# Patient Record
Sex: Female | Born: 1990
Health system: Southern US, Community
[De-identification: ages and names within clinical notes are randomized; demographics above are authoritative.]

## PROBLEM LIST (undated history)

## (undated) DIAGNOSIS — Z9109 Other allergy status, other than to drugs and biological substances: Secondary | ICD-10-CM

## (undated) DIAGNOSIS — K589 Irritable bowel syndrome without diarrhea: Secondary | ICD-10-CM

## (undated) DIAGNOSIS — K200 Eosinophilic esophagitis: Secondary | ICD-10-CM

## (undated) HISTORY — PX: UPPER GI ENDOSCOPY: SHX6162

## (undated) HISTORY — DX: Other allergy status, other than to drugs and biological substances: Z91.09

## (undated) HISTORY — PX: WISDOM TOOTH EXTRACTION: SHX21

## (undated) HISTORY — PX: COLONOSCOPY: SHX174

## (undated) HISTORY — DX: Eosinophilic esophagitis: K20.0

---
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
## 2010-01-04 ENCOUNTER — Emergency Department (HOSPITAL_COMMUNITY): Admission: EM | Admit: 2010-01-04 | Discharge: 2010-01-04 | Payer: Self-pay | Admitting: Emergency Medicine

## 2012-11-30 HISTORY — PX: BUNIONECTOMY: SHX129

## 2016-04-07 DIAGNOSIS — K200 Eosinophilic esophagitis: Secondary | ICD-10-CM | POA: Insufficient documentation

## 2016-04-07 DIAGNOSIS — J309 Allergic rhinitis, unspecified: Secondary | ICD-10-CM | POA: Insufficient documentation

## 2016-04-07 DIAGNOSIS — K219 Gastro-esophageal reflux disease without esophagitis: Secondary | ICD-10-CM | POA: Insufficient documentation

## 2016-04-07 HISTORY — DX: Eosinophilic esophagitis: K20.0

## 2016-05-22 ENCOUNTER — Encounter (HOSPITAL_COMMUNITY): Payer: Self-pay | Admitting: Emergency Medicine

## 2016-05-22 ENCOUNTER — Emergency Department (HOSPITAL_COMMUNITY)
Admission: EM | Admit: 2016-05-22 | Discharge: 2016-05-22 | Disposition: A | Discharge status: Home / Self Care | Payer: 59 | Attending: Emergency Medicine | Admitting: Emergency Medicine

## 2016-05-22 ENCOUNTER — Emergency Department (HOSPITAL_COMMUNITY): Payer: 59

## 2016-05-22 DIAGNOSIS — N201 Calculus of ureter: Secondary | ICD-10-CM | POA: Diagnosis not present

## 2016-05-22 DIAGNOSIS — R109 Unspecified abdominal pain: Secondary | ICD-10-CM | POA: Diagnosis present

## 2016-05-22 HISTORY — DX: Irritable bowel syndrome, unspecified: K58.9

## 2016-05-22 LAB — COMPREHENSIVE METABOLIC PANEL
ALK PHOS: 47 U/L (ref 38–126)
ALT: 16 U/L (ref 14–54)
ANION GAP: 12 (ref 5–15)
AST: 25 U/L (ref 15–41)
Albumin: 4.2 g/dL (ref 3.5–5.0)
BILIRUBIN TOTAL: 0.7 mg/dL (ref 0.3–1.2)
BUN: 14 mg/dL (ref 6–20)
CALCIUM: 10.2 mg/dL (ref 8.9–10.3)
CO2: 21 mmol/L — ABNORMAL LOW (ref 22–32)
Chloride: 105 mmol/L (ref 101–111)
Creatinine, Ser: 0.99 mg/dL (ref 0.44–1.00)
Glucose, Bld: 126 mg/dL — ABNORMAL HIGH (ref 65–99)
POTASSIUM: 3.5 mmol/L (ref 3.5–5.1)
Sodium: 138 mmol/L (ref 135–145)
TOTAL PROTEIN: 7.6 g/dL (ref 6.5–8.1)

## 2016-05-22 LAB — CBC
HCT: 38.9 % (ref 36.0–46.0)
HEMOGLOBIN: 13 g/dL (ref 12.0–15.0)
MCH: 28 pg (ref 26.0–34.0)
MCHC: 33.4 g/dL (ref 30.0–36.0)
MCV: 83.8 fL (ref 78.0–100.0)
Platelets: 376 10*3/uL (ref 150–400)
RBC: 4.64 MIL/uL (ref 3.87–5.11)
RDW: 12.9 % (ref 11.5–15.5)
WBC: 8.5 10*3/uL (ref 4.0–10.5)

## 2016-05-22 LAB — URINALYSIS, ROUTINE W REFLEX MICROSCOPIC
BILIRUBIN URINE: NEGATIVE
Glucose, UA: NEGATIVE mg/dL
Ketones, ur: NEGATIVE mg/dL
NITRITE: NEGATIVE
Protein, ur: NEGATIVE mg/dL
SPECIFIC GRAVITY, URINE: 1.016 (ref 1.005–1.030)
pH: 6.5 (ref 5.0–8.0)

## 2016-05-22 LAB — LIPASE, BLOOD: Lipase: 35 U/L (ref 11–51)

## 2016-05-22 LAB — URINE MICROSCOPIC-ADD ON

## 2016-05-22 LAB — POC URINE PREG, ED: PREG TEST UR: NEGATIVE

## 2016-05-22 MED ORDER — ONDANSETRON HCL 4 MG/2ML IJ SOLN
4.0000 mg | Freq: Once | INTRAMUSCULAR | Status: AC
  Administered 2016-05-22: 4 mg via INTRAVENOUS
  Filled 2016-05-22: qty 2

## 2016-05-22 MED ORDER — OXYCODONE-ACETAMINOPHEN 5-325 MG PO TABS: 1.0000 | ORAL_TABLET | Freq: Four times a day (QID) | ORAL | Status: DC | PRN

## 2016-05-22 MED ORDER — ONDANSETRON HCL 4 MG PO TABS: 4.0000 mg | ORAL_TABLET | Freq: Four times a day (QID) | ORAL | Status: DC | PRN

## 2016-05-22 MED ORDER — HYDROMORPHONE HCL 1 MG/ML IJ SOLN
1.0000 mg | Freq: Once | INTRAMUSCULAR | Status: AC
  Administered 2016-05-22: 1 mg via INTRAVENOUS
  Filled 2016-05-22: qty 1

## 2016-05-22 MED ORDER — SODIUM CHLORIDE 0.9 % IV BOLUS (SEPSIS)
1000.0000 mL | Freq: Once | INTRAVENOUS | Status: AC
Start: 2016-05-22 — End: 2016-05-22
  Administered 2016-05-22: 1000 mL via INTRAVENOUS

## 2016-05-22 MED ORDER — TAMSULOSIN HCL 0.4 MG PO CAPS: 0.4000 mg | ORAL_CAPSULE | Freq: Every day | ORAL | Status: DC

## 2016-05-22 MED ORDER — IBUPROFEN 600 MG PO TABS: 600.0000 mg | ORAL_TABLET | Freq: Four times a day (QID) | ORAL | Status: DC | PRN

## 2016-05-22 MED ORDER — KETOROLAC TROMETHAMINE 30 MG/ML IJ SOLN
30.0000 mg | Freq: Once | INTRAMUSCULAR | Status: AC
  Administered 2016-05-22: 30 mg via INTRAVENOUS
  Filled 2016-05-22: qty 1

## 2016-05-22 NOTE — ED Notes (Signed)
Pt reporting pain, burning with urination.

## 2016-05-22 NOTE — ED Provider Notes (Signed)
CSN: [id number]     Arrival date & time 05/22/16  0018 History   First MD Initiated Contact with Patient 05/22/16 [phone]     Chief Complaint  Patient presents with  . Back Pain  . Abdominal Pain     (Consider location/radiation/quality/duration/timing/severity/associated sxs/prior Treatment) HPI Comments: 25 year old female with a history of irritable bowel syndrome presents to the emergency department for evaluation of left flank pain and abdominal pain. She states that symptoms began yesterday and pain has remained constant; it is waxing and waning in severity and significantly worsened this evening. She reports that pain radiates from her left flank around to her left lower abdomen. She describes the pain as burning in sensation. She feels as though this translates to burning dysuria as well. She took Tylenol for symptoms without significant relief. She also complained of nausea which temporarily improved with Zofran. Patient has no personal history of kidney stones. She has had no vomiting, diarrhea, fever, chills, hematuria, or vaginal complaints. No history of abdominal surgeries.  Patient is a 25 y.o. female presenting with back pain and abdominal pain. The history is provided by the patient. No language interpreter was used.  Back Pain Associated symptoms: abdominal pain and dysuria   Associated symptoms: no fever   Abdominal Pain Associated symptoms: dysuria and nausea   Associated symptoms: no fever and no vomiting     Past Medical History  Diagnosis Date  . IBS (irritable bowel syndrome)    Past Surgical History  Procedure Laterality Date  . Colonoscopy    . Wisdom tooth extraction     No family history on file. Social History  Substance Use Topics  . Smoking status: Never Smoker   . Smokeless tobacco: None  . Alcohol Use: No   OB History    No data available      Review of Systems  Constitutional: Negative for fever.  Gastrointestinal: Positive for nausea and  abdominal pain. Negative for vomiting.  Genitourinary: Positive for dysuria and flank pain.  Musculoskeletal: Positive for back pain.  All other systems reviewed and are negative.   Allergies  Sesame seed (diagnostic)  Home Medications   Prior to Admission medications   Medication Sig Start Date End Date Taking? Authorizing Provider  loratadine (CLARITIN) 10 MG tablet Take 10 mg by mouth daily.   Yes Historical Provider, MD  saccharomyces boulardii (FLORASTOR) 250 MG capsule Take 250 mg by mouth daily.   Yes Historical Provider, MD  TURMERIC PO Take 1 tablet by mouth daily.   Yes Historical Provider, MD   BP 120/83 mmHg  Pulse 87  Temp(Src) 97.5 F (36.4 C) (Oral)  Resp 30  Ht 5\' 3"  (1.6 m)  Wt 57.607 kg  BMI 22.50 kg/m2  SpO2 100%  LMP 05/18/2016   Physical Exam  Constitutional: She is oriented to person, place, and time. She appears well-developed and well-nourished. No distress.  Nontoxic appearing and in no distress.  HENT:  Head: Normocephalic and atraumatic.  Eyes: Conjunctivae and EOM are normal. No scleral icterus.  Neck: Normal range of motion.  Cardiovascular: Normal rate, regular rhythm and intact distal pulses.   Pulmonary/Chest: Effort normal and breath sounds normal. No respiratory distress. She has no wheezes. She has no rales.  Respirations even and unlabored  Abdominal: Soft. She exhibits no distension. There is tenderness. There is no rebound and no guarding.  Tenderness to palpation to the left midabdomen. Abdomen is soft. No masses. No peritoneal signs.  Musculoskeletal: Normal range of  motion.  Neurological: She is alert and oriented to person, place, and time. She exhibits normal muscle tone. Coordination normal.  Patient moving all extremities.  Skin: Skin is warm and dry. No rash noted. She is not diaphoretic. No erythema. No pallor.  Psychiatric: She has a normal mood and affect. Her behavior is normal.  Nursing note and vitals reviewed.   ED  Course  Procedures (including critical care time) Labs Review Labs Reviewed  COMPREHENSIVE METABOLIC PANEL - Abnormal; Notable for the following:    CO2 21 (*)    Glucose, Bld 126 (*)    All other components within normal limits  URINALYSIS, ROUTINE W REFLEX MICROSCOPIC (NOT AT [facility]) - Abnormal; Notable for the following:    Hgb urine dipstick MODERATE (*)    Leukocytes, UA MODERATE (*)    All other components within normal limits  URINE MICROSCOPIC-ADD ON - Abnormal; Notable for the following:    Squamous Epithelial / LPF 0-5 (*)    Bacteria, UA RARE (*)    All other components within normal limits  URINE CULTURE  LIPASE, BLOOD  CBC  POC URINE PREG, ED    Imaging Review Ct Renal Stone Study  05/22/2016  CLINICAL DATA:  Initial evaluation for acute left flank pain. EXAM: CT ABDOMEN AND PELVIS WITHOUT CONTRAST TECHNIQUE: Multidetector CT imaging of the abdomen and pelvis was performed following the standard protocol without IV contrast. COMPARISON:  None available. FINDINGS: Visualized lung bases are clear. Limited noncontrast evaluation of the liver is unremarkable. Gallbladder contracted without acute abnormality. No biliary dilatation. Spleen, adrenal glands, and pancreas demonstrate a normal unenhanced appearance. Scattered nonobstructive calculi measuring up to 3 mm present within the right kidney. No radiopaque calculi seen along the course of the right renal collecting system. There is neural right-sided hydronephrosis or hydroureter. On the left, there is an obstructive 4 mm stone positioned at the left UVJ with secondary mild left hydroureteronephrosis. No other radiopaque calculi seen along the course of the left ureter. Smudgy calcifications within the left kidney likely reflects tiny punctate nonobstructive calculi. Stomach within normal limits. No evidence for bowel obstruction. No abnormal wall thickening or inflammatory fat stranding seen about the bowels. Appendix within normal  limits. Bladder are unremarkable. Uterus and ovaries normal for patient age. No free air or fluid.  No adenopathy. No acute osseous abnormality. No worrisome lytic or blastic osseous lesions. Lucent lesion within the T11 vertebral body most likely reflects a benign hemangioma. IMPRESSION: 1. 4 mm obstructive left UVJ stone with secondary mild left hydroureteronephrosis. 2. Additional punctate nonobstructive nephrolithiasis as above. 3. No other acute intra-abdominal or pelvic process. Electronically Signed   By: Rise MuBenjamin  McClintock M.D.   On: 05/22/2016 05:42     I have personally reviewed and evaluated these images and lab results as part of my medical decision-making.   EKG Interpretation None      MDM   Final diagnoses:  Ureterolithiasis    Patient has been diagnosed with a left-sided kidney stone via CT. There is no evidence of significant hydronephrosis, serum creatine WNL, vitals sign stable and the pt does not have irratractable vomiting. Patient will be discharged home with pain medications and has been advised to follow up with urology. Return precautions discussed and provided. Patient discharged in satisfactory condition with no unaddressed concerns.     Antony MaduraKelly Jenisis Harmsen, PA-C 05/22/16 [id number]  Shon Batonourtney F Horton, MD 05/24/16 2018

## 2016-05-22 NOTE — Discharge Instructions (Signed)
Kidney Stones °Kidney stones (urolithiasis) are deposits that form inside your kidneys. The intense pain is caused by the stone moving through the urinary tract. When the stone moves, the ureter goes into spasm around the stone. The stone is usually passed in the urine.  °CAUSES  °· A disorder that makes certain neck glands produce too much parathyroid hormone (primary hyperparathyroidism). °· A buildup of uric acid crystals, similar to gout in your joints. °· Narrowing (stricture) of the ureter. °· A kidney obstruction present at birth (congenital obstruction). °· Previous surgery on the kidney or ureters. °· Numerous kidney infections. °SYMPTOMS  °· Feeling sick to your stomach (nauseous). °· Throwing up (vomiting). °· Blood in the urine (hematuria). °· Pain that usually spreads (radiates) to the groin. °· Frequency or urgency of urination. °DIAGNOSIS  °· Taking a history and physical exam. °· Blood or urine tests. °· CT scan. °· Occasionally, an examination of the inside of the urinary bladder (cystoscopy) is performed. °TREATMENT  °· Observation. °· Increasing your fluid intake. °· Extracorporeal shock wave lithotripsy--This is a noninvasive procedure that uses shock waves to break up kidney stones. °· Surgery may be needed if you have severe pain or persistent obstruction. There are various surgical procedures. Most of the procedures are performed with the use of small instruments. Only small incisions are needed to accommodate these instruments, so recovery time is minimized. °The size, location, and chemical composition are all important variables that will determine the proper choice of action for you. Talk to your health care provider to better understand your situation so that you will minimize the risk of injury to yourself and your kidney.  °HOME CARE INSTRUCTIONS  °· Drink enough water and fluids to keep your urine clear or pale yellow. This will help you to pass the stone or stone fragments. °· Strain  all urine through the provided strainer. Keep all particulate matter and stones for your health care provider to see. The stone causing the pain may be as small as a grain of salt. It is very important to use the strainer each and every time you pass your urine. The collection of your stone will allow your health care provider to analyze it and verify that a stone has actually passed. The stone analysis will often identify what you can do to reduce the incidence of recurrences. °· Only take over-the-counter or prescription medicines for pain, discomfort, or fever as directed by your health care provider. °· Keep all follow-up visits as told by your health care provider. This is important. °· Get follow-up X-rays if required. The absence of pain does not always mean that the stone has passed. It may have only stopped moving. If the urine remains completely obstructed, it can cause loss of kidney function or even complete destruction of the kidney. It is your responsibility to make sure X-rays and follow-ups are completed. Ultrasounds of the kidney can show blockages and the status of the kidney. Ultrasounds are not associated with any radiation and can be performed easily in a matter of minutes. °· Make changes to your daily diet as told by your health care provider. You may be told to: °¨ Limit the amount of salt that you eat. °¨ Eat 5 or more servings of fruits and vegetables each day. °¨ Limit the amount of meat, poultry, fish, and eggs that you eat. °· Collect a 24-hour urine sample as told by your health care provider. You may need to collect another urine sample every 6-12   months. °SEEK MEDICAL CARE IF: °· You experience pain that is progressive and unresponsive to any pain medicine you have been prescribed. °SEEK IMMEDIATE MEDICAL CARE IF:  °· Pain cannot be controlled with the prescribed medicine. °· You have a fever or shaking chills. °· The severity or intensity of pain increases over 18 hours and is not  relieved by pain medicine. °· You develop a new onset of abdominal pain. °· You feel faint or pass out. °· You are unable to urinate. °  °This information is not intended to replace advice given to you by your health care provider. Make sure you discuss any questions you have with your health care provider. °  °Document Released: 11/16/2005 Document Revised: 08/07/2015 Document Reviewed: 04/19/2013 °Elsevier Interactive Patient Education ©2016 [employer] ° °

## 2016-05-22 NOTE — ED Notes (Signed)
Pt. reports low back and low abdominal pain with nausea onset today , denies emesis or diarrhea , denies fever or chills.

## 2016-09-28 ENCOUNTER — Other Ambulatory Visit: Payer: Self-pay | Admitting: Family Medicine

## 2016-09-29 ENCOUNTER — Other Ambulatory Visit: Payer: Self-pay | Admitting: Family Medicine

## 2016-09-29 DIAGNOSIS — N63 Unspecified lump in unspecified breast: Secondary | ICD-10-CM

## 2016-10-20 ENCOUNTER — Other Ambulatory Visit: Payer: 59

## 2016-10-20 ENCOUNTER — Ambulatory Visit
Admission: RE | Admit: 2016-10-20 | Discharge: 2016-10-20 | Disposition: A | Discharge status: Home / Self Care | Payer: 59 | Source: Ambulatory Visit | Attending: Family Medicine | Admitting: Family Medicine

## 2016-10-20 ENCOUNTER — Other Ambulatory Visit: Payer: Self-pay | Admitting: Family Medicine

## 2016-10-20 DIAGNOSIS — N63 Unspecified lump in unspecified breast: Secondary | ICD-10-CM

## 2017-12-29 ENCOUNTER — Other Ambulatory Visit (HOSPITAL_BASED_OUTPATIENT_CLINIC_OR_DEPARTMENT_OTHER): Payer: Self-pay

## 2017-12-29 DIAGNOSIS — G4710 Hypersomnia, unspecified: Secondary | ICD-10-CM

## 2017-12-29 NOTE — Progress Notes (Signed)
Mu

## 2018-01-31 ENCOUNTER — Ambulatory Visit (HOSPITAL_BASED_OUTPATIENT_CLINIC_OR_DEPARTMENT_OTHER): Payer: 59 | Attending: Internal Medicine | Admitting: Internal Medicine

## 2018-01-31 VITALS — Ht 63.0 in | Wt 128.0 lb

## 2018-01-31 DIAGNOSIS — G4719 Other hypersomnia: Secondary | ICD-10-CM | POA: Insufficient documentation

## 2018-01-31 DIAGNOSIS — G4710 Hypersomnia, unspecified: Secondary | ICD-10-CM

## 2018-02-01 ENCOUNTER — Ambulatory Visit (HOSPITAL_BASED_OUTPATIENT_CLINIC_OR_DEPARTMENT_OTHER): Payer: 59 | Attending: Internal Medicine | Admitting: Internal Medicine

## 2018-02-01 DIAGNOSIS — G4710 Hypersomnia, unspecified: Secondary | ICD-10-CM | POA: Diagnosis not present

## 2018-02-09 NOTE — Procedures (Signed)
   NAME: Patricia ShutterKayla Antonson DATE OF BIRTH:  06/11/1991 MEDICAL RECORD NUMBER [id number]  LOCATION: [facility]  PHYSICIAN: Deretha EmoryJames C Ezinne Yogi  DATE OF STUDY: 01/31/2018  SLEEP STUDY TYPE: Nocturnal Polysomnogram               REFERRING PHYSICIAN: Deretha Emorysborne, Floris Neuhaus C, MD  INDICATION FOR STUDY: excessive daytime sleepiness  EPWORTH SLEEPINESS SCORE:  12 HEIGHT: 5\' 3"  (160 cm)  WEIGHT: 128 lb (58.1 kg)    Body mass index is 22.67 kg/m.  NECK SIZE: 12 in.  MEDICATIONS  Patient self administered medications include: N/A. Medications administered during study include No sleep medicine administered.The patients SSRI and stimulants were held for two weeks prior to this test  SLEEP STUDY TECHNIQUE  A multi-channel overnight Polysomnography study was performed. The channels recorded and monitored were central and occipital EEG, electrooculogram (EOG), submentalis EMG (chin), nasal and oral airflow, thoracic and abdominal wall motion, anterior tibialis EMG, snore microphone, electrocardiogram, and a pulse oximetry.   TECHNICAL COMMENTS  Comments added by Technician: PT HAD TWO RESTROOM VISED Comments added by Scorer: N/A   SLEEP ARCHITECTURE  The study was initiated at 10:41:43 PM and terminated at 6:02:43 AM. The total recorded time was 441.0 minutes. EEG confirmed total sleep time was 365.0 minutes yielding a sleep efficiency of 82.8%%. Sleep onset after lights out was 50.5 minutes with a REM latency of 85.0 minutes. The patient spent 1.8%% of the night in stage N1 sleep, 35.9%% in stage N2 sleep, 36.6%% in stage N3 and 25.75% in REM. Wake after sleep onset (WASO) was 25.5 minutes. The Arousal Index was 13.3/hour.   RESPIRATORY PARAMETERS  There were a total of 0 respiratory disturbances out of which 0 were apneas ( 0 obstructive, 0 mixed, 0 central) and 0 hypopneas. The apnea/hypopnea index (AHI) was 0.0 events/hour. The central sleep apnea index was 0.0 events/hour. The REM AHI was  0.0 events/hour and NREM AHI was 0.0 events/hour. The supine AHI was 0.0 events/hour and the non supine AHI was 0.00 supine during 0.16% of sleep. Respiratory disturbances were associated with oxygen desaturation down to a nadir of 93.0% during sleep. The mean oxygen saturation during the study was 95.9%. The cumulative time under 88% oxygen saturation was 5.5 minutes.   LEG MOVEMENT DATA  The total leg movements were 0 with a resulting leg movement index of 0.0/hr . Associated arousal with leg movement index was 0.0/hr.   CARDIAC DATA  The underlying cardiac rhythm was most consistent with sinus rhythm. Mean heart rate during sleep was 72.5 bpm. Additional rhythm abnormalities include None.   IMPRESSIONS  - No Significant Obstructive Sleep apnea(OSA)  - No Significant Central Sleep Apnea (CSA) - No significant periodic leg movements(PLMs) during sleep.  - May proceed with MSLT  DIAGNOSIS  - Excessive daytime sleepiness  RECOMMENDATIONS  - May proceed with MSLT  Deretha EmoryJames C Quandre Polinski Sleep specialist, American Board of Internal Medicine  ELECTRONICALLY SIGNED ON:  02/09/2018, 9:25 PM [facility] PH: [phone]   FX: [phone] ACCREDITED BY THE AMERICAN ACADEMY OF SLEEP MEDICINE

## 2018-02-09 NOTE — Procedures (Signed)
    NAME: Patricia Harvey DATE OF BIRTH:  12-Jul-1991 MEDICAL RECORD NUMBER [id number]  LOCATION: [facility]  PHYSICIAN: Deretha EmoryJames C Osborne  DATE OF STUDY: 02/01/2018  SLEEP STUDY TYPE: Multiple Sleep Latency Test               REFERRING PHYSICIAN: Deretha Emorysborne, James C, MD  INDICATION FOR STUDY: Excessive daytime sleepiness with normal PSG  EPWORTH SLEEPINESS SCORE:  12 HEIGHT: 5\' 3"  (160 cm)  WEIGHT: 128 lb (58.1 kg)    Body mass index is 22.67 kg/m.  NECK SIZE: 12 in.  MEDICATIONS  Patient self administered medications include: N/A. Medications administered during study include No sleep medicine administered.Marland Kitchen.   SLEEP STUDY TECHNIQUE  A multiple sleep latency test was performed. The channels recorded and monitored were central and occipital EEG, electrooculogram (EOG), submentalis EMG (chin), and electrocardiogram.  TECHNICAL COMMENTS  Comments added by Technician: EDS. FATIGUE Comments added by Scorer: N/A   IMPRESSIONS  Objective evidence of hypersomnia, sleep onset latency 1:43 minutes. No sleep onset REMs present. This study does not suggest narcolepsy.  DIAGNOSIS  Idiopathic hypersomnia (327.11 [G47.11 ICD-10])  RECOMMENDATIONS  Return for follow up and management of Idiopathic Hypersomnia.  Deretha EmoryJames C Osborne Sleep specialist, American Board of Internal Medicine  ELECTRONICALLY SIGNED ON:  02/09/2018, 9:33 PM [facility] PH: [phone]   FX: [phone] ACCREDITED BY THE AMERICAN ACADEMY OF SLEEP MEDICINE

## 2018-05-06 ENCOUNTER — Encounter: Payer: Self-pay | Admitting: Allergy

## 2018-05-06 ENCOUNTER — Ambulatory Visit (INDEPENDENT_AMBULATORY_CARE_PROVIDER_SITE_OTHER): Payer: 59 | Admitting: Allergy

## 2018-05-06 ENCOUNTER — Other Ambulatory Visit: Payer: Self-pay | Admitting: Allergy

## 2018-05-06 VITALS — BP 108/60 | HR 92 | Temp 98.8°F | Resp 18 | Ht 63.0 in | Wt 129.0 lb

## 2018-05-06 DIAGNOSIS — K200 Eosinophilic esophagitis: Secondary | ICD-10-CM

## 2018-05-06 DIAGNOSIS — J309 Allergic rhinitis, unspecified: Secondary | ICD-10-CM

## 2018-05-06 DIAGNOSIS — J383 Other diseases of vocal cords: Secondary | ICD-10-CM | POA: Diagnosis not present

## 2018-05-06 DIAGNOSIS — H1010 Acute atopic conjunctivitis, unspecified eye: Secondary | ICD-10-CM | POA: Diagnosis not present

## 2018-05-06 MED ORDER — OLOPATADINE HCL 0.2 % OP SOLN: 1.0000 [drp] | Freq: Every day | OPHTHALMIC | 5 refills | Status: DC

## 2018-05-06 MED ORDER — MONTELUKAST SODIUM 10 MG PO TABS: 10.0000 mg | ORAL_TABLET | Freq: Every day | ORAL | 5 refills | Status: DC

## 2018-05-06 MED ORDER — OLOPATADINE HCL 0.1 % OP SOLN: 1.0000 [drp] | Freq: Two times a day (BID) | OPHTHALMIC | 5 refills | Status: DC

## 2018-05-06 MED ORDER — EPINEPHRINE 0.3 MG/0.3ML IJ SOAJ: 0.3000 mg | Freq: Once | INTRAMUSCULAR | 2 refills | Status: AC

## 2018-05-06 NOTE — Patient Instructions (Addendum)
Allergic rhinoconjunctivitis   -  Continue avoidance measures for pollen, dust mites   - Continue singulair, allegra, claritin, zyrtec PO daily; continue Pataday eye drops daily   - Serum environmental panel obtained today to update allergens since allergen immunotherapy is being considered.   - allergen immunotherapy discussed today including protocol, benefits and risk.  Informational handout provided.  If interested in this therapuetic option you can check with your insurance carrier for coverage.  Let us know if you would like to proceed with this option.    EoE  - continue current dietary avoidance for management of GI symptoms  Shortness of breath with activity   - discussed possible laryngeal spasm (vocal cord dysfunction) vs bronchospasm and briefly discussed breathing technique with exercise that might improve symptoms if laryngospasm   - discussed possible referral with speech therapy in future   - symptoms do not cause to stop activity and reports improve with continue activity thus does not feel she warrants albuterol inhaler at this time  Follow-up 6 months for follow-up

## 2018-05-06 NOTE — Progress Notes (Signed)
New Patient Note  RE: Patricia Harvey MRN: [id number] DOB: [birth year] Date of Office Visit: 05/06/2018  Referring provider: Vista Mink, MD Primary care provider: Tally Joe, MD  Chief Complaint: Allergies and eosinophilic esophagitis  History of present illness: Patricia Harvey is a 27 y.o. female presenting today for consultation for environmental allergies and EOE.  Patient reports years long history of sinus congestion, rhinorrhea, sneezing, itchy/watery eyes, itchy ears, scratchy throat with exposure to dust, pollen, fragrances, vacuuming, cats and dogs. She was seen by an allergist at [facility] in May 2017 and received prick and intradermal testing which revealed allergies to grass mix, oak/maple trees, ragweed, dust mites. She is on a regimen of singulair daily, zyrtec daily, claritin daily, and allegra daily; she uses flonase nasal spray, azelastine nasal spray, saline nasal spray, and a homeopathic nasal spray containing xylitol; she uses a neti pot during symptom exacerbations; she uses Pataday as well as OTC eye drops for dry itchy eyes.   Patient also has a h/o eosinophilic esophagitis diagnosed by Dr. Loreta Ave on EGD with biopsy showing 60 eos/hpf and has a h/o GERD. She was never on ingested fluticasone due to symptoms improvement with dietary changes. She reports significant improvement in symptoms with reducing acid-reflux symptoms with diet changes, and reducing intake of dairy, wheat, corn has significantly helped. She still occasional has pill dysphagia; last episode 1 wk ago. She has had food allergy testing which was negative and per her report, SB biopsy was negative for celiac disease. She has also been diagnosed with IBS which has significantly improved with the above diet changes.  Patient also reports some shortness of breath and increased yawning with running; this usually will not limit her activity and usually improves with continued activity; she denies wheezing or h/o asthma as a  child.   Review of systems: Review of Systems  Constitutional: Negative for chills, fever and malaise/fatigue.  HENT: Negative for congestion, ear discharge, ear pain, nosebleeds, sinus pain and sore throat.   Eyes: Negative for pain, discharge and redness.  Respiratory: Negative for cough, shortness of breath and wheezing.   Cardiovascular: Negative for chest pain.  Gastrointestinal: Negative for abdominal pain, constipation, diarrhea, heartburn, nausea and vomiting.  Musculoskeletal: Negative for joint pain.  Skin: Negative for itching and rash.  Neurological: Negative for headaches.    All other systems negative unless noted above in HPI  Past medical history: Past Medical History:  Diagnosis Date  . Environmental allergies   . Eosinophilic esophagitis   . IBS (irritable bowel syndrome)     Past surgical history: Past Surgical History:  Procedure Laterality Date  . COLONOSCOPY    . TONSILLECTOMY  2004  . WISDOM TOOTH EXTRACTION      Family history:  Family History  Problem Relation Age of Onset  . Allergic rhinitis Mother   . Allergic rhinitis Paternal Grandmother   . Allergic rhinitis Paternal Grandfather   . Eczema Neg Hx   . Urticaria Neg Hx   . Asthma Neg Hx   . Angioedema Neg Hx     Social history: She lives in a home with carpeting with gas and electric heating and central and window cooling.  There is a dog in the home.  There was concern for water damage and mildew but this was fixed.  There are no concerns for roaches in the home.  She is a substance abuse counselor.  She denies a smoking history.   Medication List: Allergies as of 05/06/2018  Reactions   Milk-related Compounds Diarrhea   Cramping   Sesame Seed (diagnostic) Diarrhea, Nausea Only, Other (See Comments)   cramping   Wheat Bran Diarrhea   Cramping      Medication List        Accurate as of 05/06/18  3:49 PM. Always use your most recent med list.          azelastine 0.1 %  nasal spray Commonly known as:  ASTELIN Place into the nose.   Bromelains 500 MG Tabs Take by mouth.   EPINEPHrine 0.3 mg/0.3 mL Soaj injection Commonly known as:  AUVI-Q Inject 0.3 mLs (0.3 mg total) into the muscle once for 1 dose. As directed for life-threatening allergic reactions   fexofenadine 180 MG tablet Commonly known as:  ALLEGRA Take by mouth.   fluticasone 50 MCG/ACT nasal spray Commonly known as:  FLONASE 2 sprays by Each Nare route daily.   ibuprofen 600 MG tablet Commonly known as:  ADVIL,MOTRIN Take 1 tablet (600 mg total) by mouth every 6 (six) hours as needed.   loratadine 10 MG tablet Commonly known as:  CLARITIN Take 10 mg by mouth daily.   montelukast 10 MG tablet Commonly known as:  SINGULAIR Take 1 tablet (10 mg total) by mouth at bedtime.   NAPHCON-A 0.025-0.3 % ophthalmic solution Generic drug:  naphazoline-pheniramine Place 1 drop into both eyes 4 (four) times daily as needed for eye irritation.   olopatadine 0.1 % ophthalmic solution Commonly known as:  PATANOL Place 1 drop into both eyes 2 (two) times daily.   ondansetron 4 MG disintegrating tablet Commonly known as:  ZOFRAN-ODT   ondansetron 4 MG tablet Commonly known as:  ZOFRAN Take 1 tablet (4 mg total) by mouth every 6 (six) hours as needed for nausea or vomiting.   OVER THE COUNTER MEDICATION   oxyCODONE-acetaminophen 5-325 MG tablet Commonly known as:  PERCOCET/ROXICET Take 1-2 tablets by mouth every 6 (six) hours as needed for severe pain.   saccharomyces boulardii 250 MG capsule Commonly known as:  FLORASTOR Take 250 mg by mouth daily.   sertraline 50 MG tablet Commonly known as:  ZOLOFT   tamsulosin 0.4 MG Caps capsule Commonly known as:  FLOMAX Take 1 capsule (0.4 mg total) by mouth daily. To promote stone movement   TURMERIC PO Take 1 tablet by mouth daily.   Turmeric Powd Take by mouth.       Known medication allergies: Allergies  Allergen Reactions    . Milk-Related Compounds Diarrhea    Cramping  . Sesame Seed (Diagnostic) Diarrhea, Nausea Only and Other (See Comments)    cramping  . Wheat Bran Diarrhea    Cramping     Physical examination: Blood pressure 108/60, pulse 92, temperature 98.8 F (37.1 C), temperature source Oral, resp. rate 18, height 5\' 3"  (1.6 m), weight 129 lb (58.5 kg), SpO2 97 %.  General: Alert, interactive, in no acute distress. HEENT: PERRLA, TMs pearly gray, turbinates mildly edematous without discharge, post-pharynx non erythematous. Neck: Supple without lymphadenopathy. Lungs: Clear to auscultation without wheezing, rhonchi or rales. {no increased work of breathing. CV: Normal S1, S2 without murmurs. Abdomen: Nondistended, nontender. Skin: Warm and dry, without lesions or rashes. Extremities:  No clubbing, cyanosis or edema. Neuro:   Grossly intact.  Diagnositics/Labs: None today  Assessment and plan:   Allergic rhinoconjunctivitis   -  Continue avoidance measures for pollen, dust mites   - Continue singulair, allegra, claritin, zyrtec PO daily; continue Pataday eye drops  daily   - Serum environmental panel obtained today to update allergens since allergen immunotherapy is being considered.   - allergen immunotherapy discussed today including protocol, benefits and risk.  Informational handout provided.  If interested in this therapuetic option you can check with your insurance carrier for coverage.  Let us know if you would like to proceed with this option.    EoE  - continue current dietary avoidance for management of GI symptoms  Shortness of breath with activity   - discussed possible laryngeal spasm (vocal cord dysfunction) vs bronchospasm and briefly discussed breathing technique with exercise that might improve symptoms if laryngospasm   - discussed possible referral with speech therapy in future   - symptoms do not cause to stop activity and reports improve with continue activity thus  does not feel she warrants albuterol inhaler at this time  Follow-up 6 months for follow-up  I appreciate the opportunity to take part in AlmontKayla's care. Please do not hesitate to contact me with questions.  Sincerely,   Margo AyeShaylar Raymund Manrique, MD Allergy/Immunology [facility]

## 2018-05-12 LAB — ALLERGENS, ZONE 2
Amer Sycamore IgE Qn: 1 kU/L — AB
Bahia Grass IgE: 0.69 kU/L — AB
Cat Dander IgE: <0.1 kU/L
Cedar, Mountain IgE: 0.34 kU/L — AB
Cockroach, American IgE: <0.1 kU/L
Common Silver Birch IgE: 0.58 kU/L — AB
D Farinae IgE: <0.1 kU/L
D Pteronyssinus IgE: <0.1 kU/L
G002-IGE BERMUDA GRASS: 0.56 kU/L — AB
G010-IGE JOHNSON GRASS: 0.53 kU/L — AB
Maple/Box Elder IgE: 0.73 kU/L — AB
Mugwort IgE Qn: 0.44 kU/L — AB
Oak, White IgE: 0.64 kU/L — AB
Pigweed, Rough IgE: 1.06 kU/L — AB
Plantain, English IgE: 0.49 kU/L — AB
SWEET GUM IGE RAST QL: 0.64 kU/L — AB
Sheep Sorrel IgE Qn: 0.54 kU/L — AB
T008-IGE ELM, AMERICAN: 3.05 kU/L — AB
T041-IGE HICKORY, WHITE: 34.9 kU/L — AB
TIMOTHY IGE: 0.56 kU/L — AB
W001-IGE RAGWEED, SHORT: 0.73 kU/L — AB
W020-IGE NETTLE: 1.09 kU/L — AB
WHITE MULBERRY IGE: 0.21 kU/L — AB

## 2018-05-23 ENCOUNTER — Telehealth: Payer: Self-pay

## 2018-05-23 NOTE — Telephone Encounter (Signed)
Patient called due to getting letter in mail about lab results. Patient stated that she called her insurance and they told her that the codes we gave her for the allergy injections were not enough information for them to tell her how much she would need to pay. Patient stated she needs to know how much before she can move forward and stated that she is going to have insurance call us in regards to this. I informed her the codes we gave her will be the same codes we give them. Patient understood and would like to move forward with the injections but she would need to know how much out of pocket before she can start.

## 2018-05-25 NOTE — Telephone Encounter (Signed)
Patient called to schedule her new start allergy injections for 06/08/18.

## 2018-05-27 NOTE — Addendum Note (Signed)
Addended by: Lorrin MaisPADGETT, SHAYLAR P on: 05/27/2018 08:48 AM   Modules accepted: Orders

## 2018-05-27 NOTE — Telephone Encounter (Signed)
Order done

## 2018-05-30 NOTE — Progress Notes (Signed)
VIALS EXP 05-31-19 

## 2018-06-01 DIAGNOSIS — J301 Allergic rhinitis due to pollen: Secondary | ICD-10-CM | POA: Diagnosis not present

## 2018-06-08 ENCOUNTER — Ambulatory Visit (INDEPENDENT_AMBULATORY_CARE_PROVIDER_SITE_OTHER): Payer: 59 | Admitting: *Deleted

## 2018-06-08 ENCOUNTER — Telehealth: Payer: Self-pay | Admitting: Allergy

## 2018-06-08 DIAGNOSIS — J309 Allergic rhinitis, unspecified: Secondary | ICD-10-CM | POA: Diagnosis not present

## 2018-06-08 NOTE — Progress Notes (Signed)
Immunotherapy   Patient Details  Name: Patricia Harvey MRN: [id number] Date of Birth: 11/04/1991  06/08/2018  Patricia Harvey started injections for  pollen Following schedule: B  Frequency:1 time per week Epi-Pen:Epi-Pen Available  Consent signed and patient instructions given. Patient did not wait her 30 min and left without arm being checked. Bennye AlmMildred Alick Lecomte 06/08/2018, 4:28 PM

## 2018-06-08 NOTE — Telephone Encounter (Signed)
Spoke with patient and informed her that the only thing in her vial is Pollen. She stated that the testing that was done on her back about a year ago showed she was allergic to D-mite and she wanted to be sure that pollen was the only thing she needed and that something didn't get left out of her vial.

## 2018-06-08 NOTE — Telephone Encounter (Signed)
Pt called and wanted to talk with someone about what is the vials. [phone].

## 2018-06-13 ENCOUNTER — Ambulatory Visit (INDEPENDENT_AMBULATORY_CARE_PROVIDER_SITE_OTHER): Payer: 59 | Admitting: *Deleted

## 2018-06-13 DIAGNOSIS — J309 Allergic rhinitis, unspecified: Secondary | ICD-10-CM | POA: Diagnosis not present

## 2018-06-17 ENCOUNTER — Ambulatory Visit (INDEPENDENT_AMBULATORY_CARE_PROVIDER_SITE_OTHER): Payer: 59

## 2018-06-17 DIAGNOSIS — J309 Allergic rhinitis, unspecified: Secondary | ICD-10-CM | POA: Diagnosis not present

## 2018-06-20 ENCOUNTER — Ambulatory Visit (INDEPENDENT_AMBULATORY_CARE_PROVIDER_SITE_OTHER): Payer: 59 | Admitting: *Deleted

## 2018-06-20 DIAGNOSIS — J309 Allergic rhinitis, unspecified: Secondary | ICD-10-CM | POA: Diagnosis not present

## 2018-06-24 ENCOUNTER — Ambulatory Visit (INDEPENDENT_AMBULATORY_CARE_PROVIDER_SITE_OTHER): Payer: 59

## 2018-06-24 DIAGNOSIS — J309 Allergic rhinitis, unspecified: Secondary | ICD-10-CM | POA: Diagnosis not present

## 2018-06-28 ENCOUNTER — Ambulatory Visit (INDEPENDENT_AMBULATORY_CARE_PROVIDER_SITE_OTHER): Payer: 59 | Admitting: *Deleted

## 2018-06-28 DIAGNOSIS — J309 Allergic rhinitis, unspecified: Secondary | ICD-10-CM

## 2018-07-01 ENCOUNTER — Ambulatory Visit (INDEPENDENT_AMBULATORY_CARE_PROVIDER_SITE_OTHER): Payer: 59

## 2018-07-01 DIAGNOSIS — J309 Allergic rhinitis, unspecified: Secondary | ICD-10-CM

## 2018-07-04 ENCOUNTER — Ambulatory Visit (INDEPENDENT_AMBULATORY_CARE_PROVIDER_SITE_OTHER): Payer: 59 | Admitting: *Deleted

## 2018-07-04 DIAGNOSIS — J309 Allergic rhinitis, unspecified: Secondary | ICD-10-CM

## 2018-07-07 ENCOUNTER — Ambulatory Visit (INDEPENDENT_AMBULATORY_CARE_PROVIDER_SITE_OTHER): Payer: 59 | Admitting: *Deleted

## 2018-07-07 DIAGNOSIS — J309 Allergic rhinitis, unspecified: Secondary | ICD-10-CM | POA: Diagnosis not present

## 2018-07-15 ENCOUNTER — Ambulatory Visit (INDEPENDENT_AMBULATORY_CARE_PROVIDER_SITE_OTHER): Payer: 59

## 2018-07-15 DIAGNOSIS — J309 Allergic rhinitis, unspecified: Secondary | ICD-10-CM

## 2018-07-18 ENCOUNTER — Ambulatory Visit (INDEPENDENT_AMBULATORY_CARE_PROVIDER_SITE_OTHER): Payer: 59 | Admitting: *Deleted

## 2018-07-18 DIAGNOSIS — J309 Allergic rhinitis, unspecified: Secondary | ICD-10-CM | POA: Diagnosis not present

## 2018-07-20 ENCOUNTER — Ambulatory Visit (INDEPENDENT_AMBULATORY_CARE_PROVIDER_SITE_OTHER): Payer: 59 | Admitting: *Deleted

## 2018-07-20 DIAGNOSIS — J309 Allergic rhinitis, unspecified: Secondary | ICD-10-CM | POA: Diagnosis not present

## 2018-07-27 ENCOUNTER — Ambulatory Visit (INDEPENDENT_AMBULATORY_CARE_PROVIDER_SITE_OTHER): Payer: 59

## 2018-07-27 DIAGNOSIS — J309 Allergic rhinitis, unspecified: Secondary | ICD-10-CM | POA: Diagnosis not present

## 2018-07-29 ENCOUNTER — Ambulatory Visit (INDEPENDENT_AMBULATORY_CARE_PROVIDER_SITE_OTHER): Payer: 59

## 2018-07-29 DIAGNOSIS — J309 Allergic rhinitis, unspecified: Secondary | ICD-10-CM | POA: Diagnosis not present

## 2018-08-02 ENCOUNTER — Ambulatory Visit (INDEPENDENT_AMBULATORY_CARE_PROVIDER_SITE_OTHER): Payer: 59

## 2018-08-02 ENCOUNTER — Other Ambulatory Visit: Payer: Self-pay | Admitting: Obstetrics & Gynecology

## 2018-08-02 DIAGNOSIS — R9389 Abnormal findings on diagnostic imaging of other specified body structures: Secondary | ICD-10-CM

## 2018-08-02 DIAGNOSIS — J309 Allergic rhinitis, unspecified: Secondary | ICD-10-CM

## 2018-08-04 ENCOUNTER — Ambulatory Visit (INDEPENDENT_AMBULATORY_CARE_PROVIDER_SITE_OTHER): Payer: 59 | Admitting: *Deleted

## 2018-08-04 ENCOUNTER — Ambulatory Visit
Admission: RE | Admit: 2018-08-04 | Discharge: 2018-08-04 | Disposition: A | Discharge status: Home / Self Care | Payer: 59 | Source: Ambulatory Visit | Attending: Obstetrics & Gynecology | Admitting: Obstetrics & Gynecology

## 2018-08-04 DIAGNOSIS — J309 Allergic rhinitis, unspecified: Secondary | ICD-10-CM | POA: Diagnosis not present

## 2018-08-04 DIAGNOSIS — R9389 Abnormal findings on diagnostic imaging of other specified body structures: Secondary | ICD-10-CM

## 2018-08-04 DIAGNOSIS — M5136 Other intervertebral disc degeneration, lumbar region: Secondary | ICD-10-CM | POA: Insufficient documentation

## 2018-08-04 DIAGNOSIS — M419 Scoliosis, unspecified: Secondary | ICD-10-CM | POA: Insufficient documentation

## 2018-08-08 ENCOUNTER — Ambulatory Visit (INDEPENDENT_AMBULATORY_CARE_PROVIDER_SITE_OTHER): Payer: 59 | Admitting: *Deleted

## 2018-08-08 DIAGNOSIS — J309 Allergic rhinitis, unspecified: Secondary | ICD-10-CM | POA: Diagnosis not present

## 2018-08-10 ENCOUNTER — Ambulatory Visit (INDEPENDENT_AMBULATORY_CARE_PROVIDER_SITE_OTHER): Payer: 59 | Admitting: *Deleted

## 2018-08-10 DIAGNOSIS — J309 Allergic rhinitis, unspecified: Secondary | ICD-10-CM

## 2018-08-15 ENCOUNTER — Ambulatory Visit (INDEPENDENT_AMBULATORY_CARE_PROVIDER_SITE_OTHER): Payer: 59 | Admitting: *Deleted

## 2018-08-15 DIAGNOSIS — J309 Allergic rhinitis, unspecified: Secondary | ICD-10-CM

## 2018-08-16 ENCOUNTER — Other Ambulatory Visit: Payer: Self-pay | Admitting: Obstetrics & Gynecology

## 2018-08-16 ENCOUNTER — Telehealth: Payer: Self-pay | Admitting: *Deleted

## 2018-08-16 DIAGNOSIS — R928 Other abnormal and inconclusive findings on diagnostic imaging of breast: Secondary | ICD-10-CM

## 2018-08-16 NOTE — Telephone Encounter (Signed)
Spoke with patient to follow up from yesterday shot that was given in error. She states she did not have any problems after leaving the office.

## 2018-08-22 ENCOUNTER — Ambulatory Visit (INDEPENDENT_AMBULATORY_CARE_PROVIDER_SITE_OTHER): Payer: 59

## 2018-08-22 DIAGNOSIS — J309 Allergic rhinitis, unspecified: Secondary | ICD-10-CM

## 2018-08-22 NOTE — Progress Notes (Signed)
Patient stated that when her injection is giving in the back of the arm she ends up having a reaction (large hive) however if it is giving more in the middle of the arm patient stated she doesn't have reactions. Patient will inform nurse before giving injection.

## 2018-08-30 ENCOUNTER — Ambulatory Visit (INDEPENDENT_AMBULATORY_CARE_PROVIDER_SITE_OTHER): Payer: 59 | Admitting: *Deleted

## 2018-08-30 DIAGNOSIS — J309 Allergic rhinitis, unspecified: Secondary | ICD-10-CM

## 2018-09-05 ENCOUNTER — Telehealth: Payer: Self-pay

## 2018-09-05 NOTE — Telephone Encounter (Signed)
Patient called and wanted to know if she left her water bottle in shot room waiting area. I informed her that it was here. Patient then informed me that she has been having some allergy issues and was wanting to know if she should come in tomorrow to get her injection or wait. She has been having nasal drainage, cough and has went to doctor and got some nasal sprays and cough medication. I informed patient that she should hold on off on her allergy injections this week and if she is feeling better to come next week.

## 2018-09-13 ENCOUNTER — Ambulatory Visit (INDEPENDENT_AMBULATORY_CARE_PROVIDER_SITE_OTHER): Payer: 59 | Admitting: *Deleted

## 2018-09-13 DIAGNOSIS — J309 Allergic rhinitis, unspecified: Secondary | ICD-10-CM | POA: Diagnosis not present

## 2018-09-20 ENCOUNTER — Ambulatory Visit (INDEPENDENT_AMBULATORY_CARE_PROVIDER_SITE_OTHER): Payer: 59 | Admitting: *Deleted

## 2018-09-20 ENCOUNTER — Telehealth: Payer: Self-pay | Admitting: *Deleted

## 2018-09-20 DIAGNOSIS — J309 Allergic rhinitis, unspecified: Secondary | ICD-10-CM

## 2018-09-20 MED ORDER — IPRATROPIUM BROMIDE 0.06 % NA SOLN: 2.0000 | Freq: Three times a day (TID) | NASAL | 5 refills | Status: DC | PRN

## 2018-09-20 NOTE — Progress Notes (Signed)
Patient requested last office visit note advised could not give note without a medical release advised we could give AVS printout from that day. Patient verbalized understanding and wanted AVS printed out given copy to patient

## 2018-09-20 NOTE — Telephone Encounter (Signed)
Patient came in to get allergy shot today states she got a cold and went to see pcp was prescribed Ventolin HFA and Ipratropium nasal spray. Patient states that the nasal spray has helped tremendously with eye and nasal symptoms. Patient was wondering if we can refill nasal spray for her she is aware she is due for an appt Dec 2019 appt made. Dr Delorse Lek please advise.

## 2018-09-20 NOTE — Telephone Encounter (Signed)
Called and spoke with patient, informed her that medication would be sent in. Confirmed to send medicine in to [facility] [place]. Patient advised that her inhaler was too much for the name brand. Advised patient to speak with her pharmacy to determine which type of inhaler was cheaper and to call us when she needs a refill and inform us and we will be sure to fill the less expensive emergency inhaler.

## 2018-09-20 NOTE — Telephone Encounter (Signed)
Oh yes that is fine to refill her nasal ipratropium 2 sprays each nostril TID prn nasal drainage

## 2018-09-28 ENCOUNTER — Ambulatory Visit (INDEPENDENT_AMBULATORY_CARE_PROVIDER_SITE_OTHER): Payer: 59 | Admitting: *Deleted

## 2018-09-28 DIAGNOSIS — J309 Allergic rhinitis, unspecified: Secondary | ICD-10-CM

## 2018-10-04 ENCOUNTER — Ambulatory Visit (INDEPENDENT_AMBULATORY_CARE_PROVIDER_SITE_OTHER): Payer: 59 | Admitting: *Deleted

## 2018-10-04 DIAGNOSIS — J309 Allergic rhinitis, unspecified: Secondary | ICD-10-CM

## 2018-10-05 DIAGNOSIS — J301 Allergic rhinitis due to pollen: Secondary | ICD-10-CM

## 2018-10-05 NOTE — Progress Notes (Signed)
VIAL EXP 12-15-18 

## 2018-10-11 ENCOUNTER — Ambulatory Visit (INDEPENDENT_AMBULATORY_CARE_PROVIDER_SITE_OTHER): Payer: 59 | Admitting: *Deleted

## 2018-10-11 DIAGNOSIS — J309 Allergic rhinitis, unspecified: Secondary | ICD-10-CM

## 2018-10-19 ENCOUNTER — Ambulatory Visit (INDEPENDENT_AMBULATORY_CARE_PROVIDER_SITE_OTHER): Payer: 59 | Admitting: *Deleted

## 2018-10-19 DIAGNOSIS — J309 Allergic rhinitis, unspecified: Secondary | ICD-10-CM | POA: Diagnosis not present

## 2018-10-25 ENCOUNTER — Ambulatory Visit (INDEPENDENT_AMBULATORY_CARE_PROVIDER_SITE_OTHER): Payer: 59 | Admitting: *Deleted

## 2018-10-25 DIAGNOSIS — J309 Allergic rhinitis, unspecified: Secondary | ICD-10-CM | POA: Diagnosis not present

## 2018-11-02 ENCOUNTER — Ambulatory Visit (INDEPENDENT_AMBULATORY_CARE_PROVIDER_SITE_OTHER): Payer: 59 | Admitting: *Deleted

## 2018-11-02 DIAGNOSIS — J309 Allergic rhinitis, unspecified: Secondary | ICD-10-CM | POA: Diagnosis not present

## 2018-11-04 ENCOUNTER — Ambulatory Visit: Payer: 59 | Admitting: Allergy

## 2018-11-04 ENCOUNTER — Encounter: Payer: Self-pay | Admitting: Allergy

## 2018-11-04 VITALS — BP 106/62 | HR 90 | Resp 16

## 2018-11-04 DIAGNOSIS — H1013 Acute atopic conjunctivitis, bilateral: Secondary | ICD-10-CM

## 2018-11-04 DIAGNOSIS — K200 Eosinophilic esophagitis: Secondary | ICD-10-CM

## 2018-11-04 DIAGNOSIS — J383 Other diseases of vocal cords: Secondary | ICD-10-CM

## 2018-11-04 DIAGNOSIS — J3089 Other allergic rhinitis: Secondary | ICD-10-CM | POA: Diagnosis not present

## 2018-11-04 NOTE — Progress Notes (Signed)
Follow-up Note  RE: Patricia Harvey MRN: [id number] DOB: Feb 06, 1991 Date of Office Visit: 11/04/2018   History of present illness: Patricia Harvey is a 27 y.o. female presenting today for follow-up of allergic rhinitis with conjunctivitis.  She was last seen in the office on 05/06/18 by myself.  She has not had any major health changes, surgeries or hospitalizations since her last visit.  With her EOE she states that she still continues to have the same swallowing dysfunction that she is always had.  She has not had any worsening of her symptoms.  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  She states she will be going to speech therapy for voice training.     She continues to avoid dietary of dairy, wheat and corn.  She states she is thinking about swallowed steroid but wants to think about it a bit more.     With her allergic rhinits with conjunctivitis she states she has been doing well with daily singulair, every other day of either allegra or claritin and daily xyzal.  She uses pataday as needed.   She is on allergen immunotherapy weekly and is doing well with injections.    Review of systems: Review of Systems  Constitutional: Negative for chills, fever and malaise/fatigue.  HENT: Negative for congestion, ear discharge, nosebleeds and sore throat.   Eyes: Negative for pain, discharge and redness.  Respiratory: Negative for cough, shortness of breath and wheezing.   Cardiovascular: Negative for chest pain.  Gastrointestinal: Negative for abdominal pain, constipation, diarrhea, heartburn, nausea and vomiting.  Musculoskeletal: Negative for joint pain.  Skin: Negative for itching and rash.  Neurological: Negative for headaches.    All other systems negative unless noted above in HPI  Past medical/social/surgical/family history have been reviewed and are unchanged unless specifically indicated below.  No changes  Medication  List: Allergies as of 11/04/2018      Reactions   Milk-related Compounds Diarrhea   Cramping   Sesame Seed (diagnostic) Diarrhea, Nausea Only, Other (See Comments)   cramping   Wheat Bran Diarrhea   Cramping      Medication List        Accurate as of 11/04/18  5:01 PM. Always use your most recent med list.          azelastine 0.1 % nasal spray Commonly known as:  ASTELIN Place into the nose.   Bromelains 500 MG Tabs Take by mouth.   EVEKEO 10 MG Tabs Generic drug:  Amphetamine Sulfate Evekeo 10 mg tablet   fexofenadine 180 MG tablet Commonly known as:  ALLEGRA Take by mouth.   fluticasone 50 MCG/ACT nasal spray Commonly known as:  FLONASE 2 sprays by Each Nare route daily.   ibuprofen 600 MG tablet Commonly known as:  ADVIL,MOTRIN Take 1 tablet (600 mg total) by mouth every 6 (six) hours as needed.   ipratropium 0.06 % nasal spray Commonly known as:  ATROVENT Place 2 sprays into both nostrils 3 (three) times daily as needed for rhinitis.   loratadine 10 MG tablet Commonly known as:  CLARITIN Take 10 mg by mouth daily.   montelukast 10 MG tablet Commonly known as:  SINGULAIR Take 1 tablet (10 mg total) by mouth at bedtime.   olopatadine 0.1 % ophthalmic solution Commonly known as:  PATANOL Place 1 drop into both eyes 2 (two) times daily.   saccharomyces boulardii 250 MG capsule Commonly known  as:  FLORASTOR Take 250 mg by mouth daily.   sertraline 50 MG tablet Commonly known as:  ZOLOFT   TURMERIC PO Take 1 tablet by mouth daily.   Turmeric Powd Take by mouth.   VENTOLIN HFA 108 (90 Base) MCG/ACT inhaler Generic drug:  albuterol Inhale 2 puffs into the lungs every 4 (four) hours as needed for wheezing or shortness of breath.       Known medication allergies: Allergies  Allergen Reactions  . Milk-Related Compounds Diarrhea    Cramping  . Sesame Seed (Diagnostic) Diarrhea, Nausea Only and Other (See Comments)    cramping  . Wheat Bran  Diarrhea    Cramping     Physical examination: Blood pressure 106/62, pulse 90, resp. rate 16.  General: Alert, interactive, in no acute distress. HEENT: PERRLA, TMs pearly gray, turbinates minimally edematous without discharge, post-pharynx non erythematous. Neck: Supple without lymphadenopathy. Lungs: Clear to auscultation without wheezing, rhonchi or rales. {no increased work of breathing. CV: Normal S1, S2 without murmurs. Abdomen: Nondistended, nontender. Skin: Warm and dry, without lesions or rashes. Extremities:  No clubbing, cyanosis or edema. Neuro:   Grossly intact.  Diagnositics/Labs: Labs:  Component     Latest Ref Rng & Units 05/06/2018  D Pteronyssinus IgE     Class 0 kU/L <0.10  D Farinae IgE     Class 0 kU/L <0.10  Cat Dander IgE     Class 0 kU/L <0.10  Dog Dander IgE     Class 0 kU/L <0.10  [place] Grass IgE     Class II kU/L 0.56 (A)  Timothy Grass IgE     Class II kU/L 0.56 (A)  Johnson Grass IgE     Class I kU/L 0.53 (A)  Bahia Grass IgE     Class II kU/L 0.69 (A)  Cockroach, American IgE     Class 0 kU/L <0.10  Penicillium Chrysogen IgE     Class 0 kU/L <0.10  Cladosporium Herbarum IgE     Class 0 kU/L <0.10  Aspergillus Fumigatus IgE     Class 0 kU/L <0.10  Mucor Racemosus IgE     Class 0 kU/L <0.10  Alternaria Alternata IgE     Class 0 kU/L <0.10  Stemphylium Herbarum IgE     Class 0 kU/L <0.10  Common Silver Charletta Cousin IgE     Class II kU/L 0.58 (A)  Oak, White IgE     Class II kU/L 0.64 (A)  Elm, American IgE     Class III kU/L 3.05 (A)  Maple/Box Elder IgE     Class II kU/L 0.73 (A)  Hickory, White IgE     Class V kU/L 34.90 (A)  Amer Sycamore IgE Qn     Class II kU/L 1.00 (A)  White Mulberry IgE     Class 0/I kU/L 0.21 (A)  Sweet gum IgE RAST Ql     Class II kU/L 0.64 (A)  Cedar, Mountain IgE     Class I kU/L 0.34 (A)  Ragweed, Short IgE     Class II kU/L 0.73 (A)  Mugwort IgE Qn     Class I kU/L 0.44 (A)  Plantain, English  IgE     Class I kU/L 0.49 (A)  Pigweed, Rough IgE     Class II kU/L 1.06 (A)  Sheep Sorrel IgE Qn     Class I kU/L 0.54 (A)  Nettle IgE     Class II kU/L 1.09 (A)   Assessment  and plan:   Allergic rhinoconjunctivitis    - Continue avoidance measures for pollen, dust mites   - Continue singulair, allegra, claritin, and xyzal per your schedule; continue Pataday eye drops daily   - Continue allergen immunotherapy (allergy shots) per schedule  EoE  - continue current dietary avoidance for management of GI symptoms  -We did talk about swallowed steroid today and she is not ready to start this but she states she will think about it and may start after her next visit.  I did discuss the amount of eosinophils that she had in her esophagus on her last biopsy was quite a bit at 60 per high-power field.  She has a potential to have complications with her EOE and she is aware of these complications.  I encouraged her to have yearly check-in's with her GI to ensure that symptoms are not worsening and that she does not need to be rescoped.  Vocal cord dysfunction   -She had a laryngoscopy performed by ENT that did show dysfunction of her vocal cord she will be undergoing speech therapy for voice retraining.  Follow-up 6 months for follow-up  I appreciate the opportunity to take part in Patricia Harvey's care. Please do not hesitate to contact me with questions.  Sincerely,   Margo AyeShaylar Zayanna Pundt, MD Allergy/Immunology [facility]

## 2018-11-04 NOTE — Patient Instructions (Addendum)
Allergic rhinoconjunctivitis   - Continue avoidance measures for pollen, dust mites   - Continue singulair, allegra, claritin, and xyzal per your schedule; continue Pataday eye drops daily   - Continue allergen immunotherapy (allergy shots) per schedule  EoE  - continue current dietary avoidance for management of GI symptoms  Vocal cord dysfunction   -She had a laryngoscopy performed by ENT that did show dysfunction of her vocal cord she will be undergoing speech therapy for voice retraining.   Follow-up 6 months for follow-up

## 2018-11-08 ENCOUNTER — Ambulatory Visit (INDEPENDENT_AMBULATORY_CARE_PROVIDER_SITE_OTHER): Payer: 59 | Admitting: *Deleted

## 2018-11-08 DIAGNOSIS — J309 Allergic rhinitis, unspecified: Secondary | ICD-10-CM

## 2018-11-14 ENCOUNTER — Ambulatory Visit (INDEPENDENT_AMBULATORY_CARE_PROVIDER_SITE_OTHER): Payer: 59 | Admitting: *Deleted

## 2018-11-14 DIAGNOSIS — J309 Allergic rhinitis, unspecified: Secondary | ICD-10-CM

## 2018-11-25 ENCOUNTER — Telehealth: Payer: Self-pay

## 2018-11-25 NOTE — Telephone Encounter (Signed)
Her immunotherapy script was based on environmental panel as we discussed the options of testing and opted for the environmental panel vs skin prick testing.   Her environmental panel was negative to cats as to why it is not in her vials.  If she feels she indeed allergic to cats she will need to come in for a skin testing visit where we can just to controls and cat.  If it is positive the options would be to have a cat only vial as not to disrupt the current vial she has not vs adding cat to current vial (if skin test positive) and starting over again.

## 2018-11-25 NOTE — Telephone Encounter (Signed)
Patient called and stated that she is having reactions when around cats. She stated that the reactions are getting worse every time. Patient stated that she is allergic to cats but only decided to do pollens in her allergy vial and is wondering if she should start injections for cat.

## 2018-11-28 ENCOUNTER — Ambulatory Visit (INDEPENDENT_AMBULATORY_CARE_PROVIDER_SITE_OTHER): Payer: 59 | Admitting: *Deleted

## 2018-11-28 DIAGNOSIS — J309 Allergic rhinitis, unspecified: Secondary | ICD-10-CM

## 2018-11-28 NOTE — Telephone Encounter (Signed)
Patient is scheduled for 12-29-2017 at 200(advised to arrive by 145) Patient advised to stay off of antihistamines for 72 hours prior to visit.

## 2018-12-07 ENCOUNTER — Ambulatory Visit (INDEPENDENT_AMBULATORY_CARE_PROVIDER_SITE_OTHER): Payer: 59

## 2018-12-07 DIAGNOSIS — J309 Allergic rhinitis, unspecified: Secondary | ICD-10-CM

## 2018-12-13 ENCOUNTER — Ambulatory Visit (INDEPENDENT_AMBULATORY_CARE_PROVIDER_SITE_OTHER): Payer: 59

## 2018-12-13 DIAGNOSIS — J309 Allergic rhinitis, unspecified: Secondary | ICD-10-CM

## 2018-12-20 ENCOUNTER — Ambulatory Visit (INDEPENDENT_AMBULATORY_CARE_PROVIDER_SITE_OTHER): Payer: 59 | Admitting: *Deleted

## 2018-12-20 DIAGNOSIS — J309 Allergic rhinitis, unspecified: Secondary | ICD-10-CM | POA: Diagnosis not present

## 2018-12-29 ENCOUNTER — Ambulatory Visit: Payer: Self-pay | Admitting: *Deleted

## 2018-12-29 ENCOUNTER — Encounter: Payer: Self-pay | Admitting: Allergy

## 2018-12-29 ENCOUNTER — Ambulatory Visit: Payer: 59 | Admitting: Allergy

## 2018-12-29 VITALS — BP 112/64 | HR 84 | Resp 16

## 2018-12-29 DIAGNOSIS — J3089 Other allergic rhinitis: Secondary | ICD-10-CM

## 2018-12-29 DIAGNOSIS — H1013 Acute atopic conjunctivitis, bilateral: Secondary | ICD-10-CM

## 2018-12-29 NOTE — Patient Instructions (Addendum)
Allergic rhinoconjunctivitis   - skin testing to cat and dust mites today is positive to cat only   - Continue avoidance measures for pollen and cat   - Continue singulair, allegra, claritin, and xyzal per your schedule; continue Pataday eye drops daily   - Continue allergen immunotherapy (allergy shots) per schedule.  Will add new vial with cat to buildp-up and once at maintenance can combine with her pollen vial.

## 2018-12-29 NOTE — Progress Notes (Signed)
Follow-up Note  RE: Patricia Harvey: [id number] DOB: 1991/04/11 Date of Office Visit: 12/29/2018   History of present illness: Patricia Harvey is a 28 y.o. female presenting today for skin testing.  She was last seen in the office on 11/04/18 by myself.  She is on allergen immunotherapy containing pollens.  She states now that when she is around cats she gets itchy and wants to be tested for cat to potentially be included in her immunotherapy.  She also states that she wakes up congested some days and becomes symptomatic with house cleaning and also wants to be tested for dust mites.   She has held her antihistamines for testing today.  She states with being off her antihistamines she has been very itchy.   Review of systems: Review of Systems  Constitutional: Negative for chills, fever and malaise/fatigue.  HENT: Positive for congestion. Negative for ear discharge, ear pain, nosebleeds and sore throat.   Eyes: Negative for pain, discharge and redness.  Respiratory: Negative for cough, shortness of breath and wheezing.   Cardiovascular: Negative for chest pain.  Gastrointestinal: Negative for abdominal pain, constipation, diarrhea, heartburn, nausea and vomiting.  Musculoskeletal: Negative for joint pain.  Skin: Positive for itching. Negative for rash.  Neurological: Negative for headaches.    All other systems negative unless noted above in HPI  Past medical/social/surgical/family history have been reviewed and are unchanged unless specifically indicated below.  No changes  Medication List: Allergies as of 12/29/2018      Reactions   Milk-related Compounds Diarrhea   Cramping   Sesame Seed (diagnostic) Diarrhea, Nausea Only, Other (See Comments)   cramping   Wheat Bran Diarrhea   Cramping      Medication List       Accurate as of December 29, 2018  5:03 PM. Always use your most recent med list.        Armodafinil 150 MG tablet   azelastine 0.1 % nasal spray Commonly  known as:  ASTELIN Place into the nose.   Bromelains 500 MG Tabs Take by mouth.   EVEKEO 10 MG Tabs Generic drug:  Amphetamine Sulfate Evekeo 10 mg tablet   fexofenadine 180 MG tablet Commonly known as:  ALLEGRA Take by mouth.   fluticasone 50 MCG/ACT nasal spray Commonly known as:  FLONASE 2 sprays by Each Nare route daily.   ibuprofen 600 MG tablet Commonly known as:  ADVIL,MOTRIN Take 1 tablet (600 mg total) by mouth every 6 (six) hours as needed.   ipratropium 0.06 % nasal spray Commonly known as:  ATROVENT Place 2 sprays into both nostrils 3 (three) times daily as needed for rhinitis.   loratadine 10 MG tablet Commonly known as:  CLARITIN Take 10 mg by mouth daily.   montelukast 10 MG tablet Commonly known as:  SINGULAIR Take 1 tablet (10 mg total) by mouth at bedtime.   olopatadine 0.1 % ophthalmic solution Commonly known as:  PATANOL Place 1 drop into both eyes 2 (two) times daily.   saccharomyces boulardii 250 MG capsule Commonly known as:  FLORASTOR Take 250 mg by mouth daily.   sertraline 100 MG tablet Commonly known as:  ZOLOFT   TURMERIC PO Take 1 tablet by mouth daily.   VENTOLIN HFA 108 (90 Base) MCG/ACT inhaler Generic drug:  albuterol Inhale 2 puffs into the lungs every 4 (four) hours as needed for wheezing or shortness of breath.       Known medication allergies: Allergies  Allergen Reactions  .  Milk-Related Compounds Diarrhea    Cramping  . Sesame Seed (Diagnostic) Diarrhea, Nausea Only and Other (See Comments)    cramping  . Wheat Bran Diarrhea    Cramping     Physical examination: Blood pressure 112/64, pulse 84, resp. rate 16. Physical Exam  Constitutional: She is well-developed, well-nourished, and in no distress.  HENT:  Head: Normocephalic and atraumatic.  Eyes: Conjunctivae and EOM are normal. Right eye exhibits no discharge.  Neck: Normal range of motion.  Pulmonary/Chest: Effort normal.  Musculoskeletal: Normal  range of motion.  Neurological: She is alert.  Skin: Skin is warm and dry.    Diagnositics/Labs: Allergy testing: skin prick testing is positive to cat. Negative to dust mites.  Histamine control is positive.   Allergy testing results were read and interpreted by provider, documented by clinical staff.   Assessment and plan:   Allergic rhinitis with conjunctivitis   - skin testing to cat and dust mites today is positive to cat only   - Continue avoidance measures for pollen and cat   - Continue singulair, allegra, claritin, and xyzal per your schedule; continue Pataday eye drops daily   - Continue allergen immunotherapy (allergy shots) per schedule.  Will add new vial with cat to buildp-up and once at maintenance can combine with her pollen vial.   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   I appreciate the opportunity to take part in [place] care. Please do not hesitate to contact me with questions.  Sincerely,   Margo AyeShaylar Amadea Keagy, MD Allergy/Immunology [facility]

## 2019-01-03 ENCOUNTER — Ambulatory Visit (INDEPENDENT_AMBULATORY_CARE_PROVIDER_SITE_OTHER): Payer: 59

## 2019-01-03 DIAGNOSIS — J3089 Other allergic rhinitis: Secondary | ICD-10-CM

## 2019-01-10 ENCOUNTER — Ambulatory Visit (INDEPENDENT_AMBULATORY_CARE_PROVIDER_SITE_OTHER): Payer: 59 | Admitting: *Deleted

## 2019-01-10 DIAGNOSIS — J309 Allergic rhinitis, unspecified: Secondary | ICD-10-CM

## 2019-01-10 NOTE — Progress Notes (Signed)
VIALS EXP 01-11-2020 

## 2019-01-11 DIAGNOSIS — J3081 Allergic rhinitis due to animal (cat) (dog) hair and dander: Secondary | ICD-10-CM | POA: Diagnosis not present

## 2019-01-13 ENCOUNTER — Ambulatory Visit (INDEPENDENT_AMBULATORY_CARE_PROVIDER_SITE_OTHER): Payer: 59 | Admitting: *Deleted

## 2019-01-13 DIAGNOSIS — J309 Allergic rhinitis, unspecified: Secondary | ICD-10-CM | POA: Diagnosis not present

## 2019-01-13 NOTE — Progress Notes (Signed)
Immunotherapy   Patient Details  Name: Patricia Harvey MRN: [id number] Date of Birth: [birth year]  01/13/2019  Patricia Harvey started injections for  Cat Following schedule: B  Frequency:2 times per week Epi-Pen:Epi-Pen Available  Consent signed and patient instructions given. No problems after 30 minutes in the office.    Mariane Duval 01/13/2019, 4:22 PM

## 2019-01-17 ENCOUNTER — Ambulatory Visit (INDEPENDENT_AMBULATORY_CARE_PROVIDER_SITE_OTHER): Payer: 59 | Admitting: *Deleted

## 2019-01-17 DIAGNOSIS — J309 Allergic rhinitis, unspecified: Secondary | ICD-10-CM

## 2019-01-18 DIAGNOSIS — J301 Allergic rhinitis due to pollen: Secondary | ICD-10-CM | POA: Diagnosis not present

## 2019-01-18 NOTE — Progress Notes (Signed)
VIAL FOR POLLEN EXP 01-19-2020

## 2019-01-19 ENCOUNTER — Ambulatory Visit (INDEPENDENT_AMBULATORY_CARE_PROVIDER_SITE_OTHER): Payer: 59

## 2019-01-19 DIAGNOSIS — J309 Allergic rhinitis, unspecified: Secondary | ICD-10-CM

## 2019-01-23 ENCOUNTER — Ambulatory Visit (INDEPENDENT_AMBULATORY_CARE_PROVIDER_SITE_OTHER): Payer: 59 | Admitting: *Deleted

## 2019-01-23 DIAGNOSIS — J309 Allergic rhinitis, unspecified: Secondary | ICD-10-CM

## 2019-01-26 ENCOUNTER — Ambulatory Visit (INDEPENDENT_AMBULATORY_CARE_PROVIDER_SITE_OTHER): Payer: 59 | Admitting: *Deleted

## 2019-01-26 DIAGNOSIS — J309 Allergic rhinitis, unspecified: Secondary | ICD-10-CM

## 2019-01-31 ENCOUNTER — Ambulatory Visit (INDEPENDENT_AMBULATORY_CARE_PROVIDER_SITE_OTHER): Payer: 59 | Admitting: *Deleted

## 2019-01-31 DIAGNOSIS — J309 Allergic rhinitis, unspecified: Secondary | ICD-10-CM

## 2019-02-02 ENCOUNTER — Ambulatory Visit (INDEPENDENT_AMBULATORY_CARE_PROVIDER_SITE_OTHER): Payer: 59 | Admitting: *Deleted

## 2019-02-02 DIAGNOSIS — J309 Allergic rhinitis, unspecified: Secondary | ICD-10-CM | POA: Diagnosis not present

## 2019-02-07 ENCOUNTER — Ambulatory Visit (INDEPENDENT_AMBULATORY_CARE_PROVIDER_SITE_OTHER): Payer: 59 | Admitting: *Deleted

## 2019-02-07 DIAGNOSIS — J309 Allergic rhinitis, unspecified: Secondary | ICD-10-CM | POA: Diagnosis not present

## 2019-02-16 ENCOUNTER — Ambulatory Visit (INDEPENDENT_AMBULATORY_CARE_PROVIDER_SITE_OTHER): Payer: 59 | Admitting: *Deleted

## 2019-02-16 DIAGNOSIS — J309 Allergic rhinitis, unspecified: Secondary | ICD-10-CM | POA: Diagnosis not present

## 2019-02-20 ENCOUNTER — Ambulatory Visit (INDEPENDENT_AMBULATORY_CARE_PROVIDER_SITE_OTHER): Payer: 59 | Admitting: *Deleted

## 2019-02-20 DIAGNOSIS — J309 Allergic rhinitis, unspecified: Secondary | ICD-10-CM

## 2019-02-22 ENCOUNTER — Ambulatory Visit (INDEPENDENT_AMBULATORY_CARE_PROVIDER_SITE_OTHER): Payer: 59 | Admitting: *Deleted

## 2019-02-22 DIAGNOSIS — J309 Allergic rhinitis, unspecified: Secondary | ICD-10-CM | POA: Diagnosis not present

## 2019-02-27 ENCOUNTER — Ambulatory Visit (INDEPENDENT_AMBULATORY_CARE_PROVIDER_SITE_OTHER): Payer: 59 | Admitting: *Deleted

## 2019-02-27 DIAGNOSIS — J309 Allergic rhinitis, unspecified: Secondary | ICD-10-CM | POA: Diagnosis not present

## 2019-03-03 ENCOUNTER — Ambulatory Visit (INDEPENDENT_AMBULATORY_CARE_PROVIDER_SITE_OTHER): Payer: 59 | Admitting: *Deleted

## 2019-03-03 DIAGNOSIS — J309 Allergic rhinitis, unspecified: Secondary | ICD-10-CM | POA: Diagnosis not present

## 2019-03-06 ENCOUNTER — Ambulatory Visit (INDEPENDENT_AMBULATORY_CARE_PROVIDER_SITE_OTHER): Payer: 59 | Admitting: *Deleted

## 2019-03-06 DIAGNOSIS — J309 Allergic rhinitis, unspecified: Secondary | ICD-10-CM

## 2019-03-09 ENCOUNTER — Ambulatory Visit (INDEPENDENT_AMBULATORY_CARE_PROVIDER_SITE_OTHER): Payer: 59

## 2019-03-09 DIAGNOSIS — J309 Allergic rhinitis, unspecified: Secondary | ICD-10-CM

## 2019-03-09 NOTE — Progress Notes (Signed)
Patient was given 0.5 Red #2 of Pollen  and Green 0.1 of Cat on 03/06/2019. Patient was unaware of only weekly injection and was give another 0.5 of Red #2 Pollen and was not given Green Cat due to error on my part that was considered a non error since she is only suppose to receive weekly injection. Advise patient of situation and patient was okay during our conversation. Patient did not express of any problems with itchy, Edema, pain or redness in the sight of injection. No c/o of SOB, Cough, wheezing, or Anaphylaxis reaction. Will contact patient at a later time to see how patient is doing.

## 2019-03-09 NOTE — Progress Notes (Signed)
Left a detail message on patient phone regarding any reaction to use her epi pen if needed, any c/o sob, warm to touch, pain or any reaction to give [place] a call or seek medical attention right away.

## 2019-03-15 ENCOUNTER — Ambulatory Visit (INDEPENDENT_AMBULATORY_CARE_PROVIDER_SITE_OTHER): Payer: 59

## 2019-03-15 DIAGNOSIS — J309 Allergic rhinitis, unspecified: Secondary | ICD-10-CM | POA: Diagnosis not present

## 2019-03-20 ENCOUNTER — Ambulatory Visit (INDEPENDENT_AMBULATORY_CARE_PROVIDER_SITE_OTHER): Payer: 59

## 2019-03-20 DIAGNOSIS — J309 Allergic rhinitis, unspecified: Secondary | ICD-10-CM

## 2019-03-27 ENCOUNTER — Ambulatory Visit (INDEPENDENT_AMBULATORY_CARE_PROVIDER_SITE_OTHER): Payer: 59 | Admitting: *Deleted

## 2019-03-27 DIAGNOSIS — J309 Allergic rhinitis, unspecified: Secondary | ICD-10-CM

## 2019-03-30 DIAGNOSIS — M5412 Radiculopathy, cervical region: Secondary | ICD-10-CM | POA: Insufficient documentation

## 2019-04-04 ENCOUNTER — Ambulatory Visit (INDEPENDENT_AMBULATORY_CARE_PROVIDER_SITE_OTHER): Payer: 59 | Admitting: *Deleted

## 2019-04-04 DIAGNOSIS — J309 Allergic rhinitis, unspecified: Secondary | ICD-10-CM

## 2019-04-12 NOTE — Progress Notes (Signed)
VIAL EXP 04-11-2020 

## 2019-04-13 ENCOUNTER — Ambulatory Visit (INDEPENDENT_AMBULATORY_CARE_PROVIDER_SITE_OTHER): Payer: 59

## 2019-04-13 DIAGNOSIS — J309 Allergic rhinitis, unspecified: Secondary | ICD-10-CM | POA: Diagnosis not present

## 2019-04-18 DIAGNOSIS — J301 Allergic rhinitis due to pollen: Secondary | ICD-10-CM | POA: Diagnosis not present

## 2019-04-19 ENCOUNTER — Ambulatory Visit (INDEPENDENT_AMBULATORY_CARE_PROVIDER_SITE_OTHER): Payer: 59 | Admitting: *Deleted

## 2019-04-19 DIAGNOSIS — J309 Allergic rhinitis, unspecified: Secondary | ICD-10-CM

## 2019-04-27 ENCOUNTER — Ambulatory Visit (INDEPENDENT_AMBULATORY_CARE_PROVIDER_SITE_OTHER): Payer: 59

## 2019-04-27 DIAGNOSIS — J309 Allergic rhinitis, unspecified: Secondary | ICD-10-CM

## 2019-05-03 ENCOUNTER — Ambulatory Visit (INDEPENDENT_AMBULATORY_CARE_PROVIDER_SITE_OTHER): Payer: 59

## 2019-05-03 DIAGNOSIS — J309 Allergic rhinitis, unspecified: Secondary | ICD-10-CM | POA: Diagnosis not present

## 2019-05-04 DIAGNOSIS — F419 Anxiety disorder, unspecified: Secondary | ICD-10-CM | POA: Insufficient documentation

## 2019-05-04 DIAGNOSIS — R42 Dizziness and giddiness: Secondary | ICD-10-CM | POA: Insufficient documentation

## 2019-05-04 DIAGNOSIS — G4711 Idiopathic hypersomnia with long sleep time: Secondary | ICD-10-CM | POA: Insufficient documentation

## 2019-05-04 DIAGNOSIS — F4310 Post-traumatic stress disorder, unspecified: Secondary | ICD-10-CM | POA: Insufficient documentation

## 2019-05-04 DIAGNOSIS — Z9189 Other specified personal risk factors, not elsewhere classified: Secondary | ICD-10-CM | POA: Insufficient documentation

## 2019-05-05 ENCOUNTER — Telehealth: Payer: Self-pay | Admitting: *Deleted

## 2019-05-05 NOTE — Telephone Encounter (Signed)
REFERRAL SENT TO SCHEDULING AND NOTES ON FILE FROM DR. MEGAN MANSELL [facility] [phone].

## 2019-05-12 ENCOUNTER — Ambulatory Visit (INDEPENDENT_AMBULATORY_CARE_PROVIDER_SITE_OTHER): Payer: 59 | Admitting: *Deleted

## 2019-05-12 DIAGNOSIS — J309 Allergic rhinitis, unspecified: Secondary | ICD-10-CM

## 2019-05-15 ENCOUNTER — Ambulatory Visit (INDEPENDENT_AMBULATORY_CARE_PROVIDER_SITE_OTHER): Payer: 59 | Admitting: *Deleted

## 2019-05-15 DIAGNOSIS — J309 Allergic rhinitis, unspecified: Secondary | ICD-10-CM

## 2019-05-24 ENCOUNTER — Telehealth: Payer: Self-pay | Admitting: *Deleted

## 2019-05-24 ENCOUNTER — Encounter: Payer: Self-pay | Admitting: Internal Medicine

## 2019-05-24 ENCOUNTER — Ambulatory Visit (INDEPENDENT_AMBULATORY_CARE_PROVIDER_SITE_OTHER): Payer: 59 | Admitting: *Deleted

## 2019-05-24 ENCOUNTER — Telehealth: Payer: Self-pay | Admitting: Radiology

## 2019-05-24 ENCOUNTER — Telehealth (INDEPENDENT_AMBULATORY_CARE_PROVIDER_SITE_OTHER): Payer: 59 | Admitting: Internal Medicine

## 2019-05-24 ENCOUNTER — Other Ambulatory Visit: Payer: Self-pay | Admitting: *Deleted

## 2019-05-24 VITALS — BP 90/52 | HR 92 | Ht 62.5 in | Wt 129.0 lb

## 2019-05-24 DIAGNOSIS — J309 Allergic rhinitis, unspecified: Secondary | ICD-10-CM

## 2019-05-24 DIAGNOSIS — R002 Palpitations: Secondary | ICD-10-CM

## 2019-05-24 DIAGNOSIS — R55 Syncope and collapse: Secondary | ICD-10-CM | POA: Diagnosis not present

## 2019-05-24 DIAGNOSIS — R42 Dizziness and giddiness: Secondary | ICD-10-CM

## 2019-05-24 NOTE — Telephone Encounter (Signed)
Letter has been typed, printed, signed, and placed up front for the patient. Called the patient and left a detailed voicemail per DPR permission explaining to use Xyzal in place of Claritin and to purchase OTC Pataday 0.2% to see if that will help with her symptoms.

## 2019-05-24 NOTE — Telephone Encounter (Signed)
Patient came in for her allergy injection and stated that she has been having a hard time with her allergies. She denies any reactions with her allergy injections. Patient states that she has been having a raspy voice and dry throat for the past 3 weeks now. Patient is taking Singulair at night and Claritin and Allergra in the morning. Patient states that she feels as if the medicines are not helping with her symptoms. Advised to patient to try taking Xyzal at night with her Singulair and Allergra in the morning to see how that helps with her allergy symptoms. Patient also stated that her eyes have been very dry and itchy and she has been using the Patanol but states that it is not helping with her symptoms. She is also concerned about her breathing while wearing face masks. She states that her job is requiring she wear cloth face masks at work which she states is causing her to have trouble breathing. 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 Patient states her breathing is more tolerable when she wears a regular disposable mask. She is wondering if she can get a letter for work stating that it would be better for her to wear a regular mask instead of a cloth one. Please advise.

## 2019-05-24 NOTE — Patient Instructions (Addendum)
Medication Instructions:  Your physician recommends that you continue on your current medications as directed. Please refer to the Current Medication list given to you today.  If you need a refill on your cardiac medications before your next appointment, please call your pharmacy.   Lab work: NONE  Testing/Procedures: Your physician has requested that you have an echocardiogram. Echocardiography is a painless test that uses sound waves to create images of your heart. It provides your doctor with information about the size and shape of your heart and how well your heart's chambers and valves are working. This procedure takes approximately one hour. There are no restrictions for this procedure. [facility] AT [address]  Your physician has recommended that you wear an event monitor. Event monitors are medical devices that record the heart's electrical activity. Doctors most often us these monitors to diagnose arrhythmias. Arrhythmias are problems with the speed or rhythm of the heartbeat. The monitor is a small, portable device. You can wear one while you do your normal daily activities. This is usually used to diagnose what is causing palpitations/syncope (passing out). 30 DAY EVENT MONITOR   Follow-Up: TO BE DECIDED AFTER YOUR ABOVE TEST RESULT ARE BACK   Any Other Special Instructions Will Be Listed Below (If Applicable).   Echocardiogram An echocardiogram is a procedure that uses painless sound waves (ultrasound) to produce an image of the heart. Images from an echocardiogram can provide important information about:  Signs of coronary artery disease (CAD).  Aneurysm detection. An aneurysm is a weak or damaged part of an artery wall that bulges out from the normal force of blood pumping through the body.  Heart size and shape. Changes in the size or shape of the heart can be associated with certain conditions, including heart failure, aneurysm, and CAD.  Heart muscle  function.  Heart valve function.  Signs of a past heart attack.  Fluid buildup around the heart.  Thickening of the heart muscle.  A tumor or infectious growth around the heart valves. Tell a health care provider about:  Any allergies you have.  All medicines you are taking, including vitamins, herbs, eye drops, creams, and over-the-counter medicines.  Any blood disorders you have.  Any surgeries you have had.  Any medical conditions you have.  Whether you are pregnant or may be pregnant. What are the risks? Generally, this is a safe procedure. However, problems may occur, including:  Allergic reaction to dye (contrast) that may be used during the procedure. What happens before the procedure? No specific preparation is needed. You may eat and drink normally. What happens during the procedure?   An IV tube may be inserted into one of your veins.  You may receive contrast through this tube. A contrast is an injection that improves the quality of the pictures from your heart.  A gel will be applied to your chest.  A wand-like tool (transducer) will be moved over your chest. The gel will help to transmit the sound waves from the transducer.  The sound waves will harmlessly bounce off of your heart to allow the heart images to be captured in real-time motion. The images will be recorded on a computer. The procedure may vary among health care providers and hospitals. What happens after the procedure?  You may return to your normal, everyday life, including diet, activities, and medicines, unless your health care provider tells you not to do that. Summary  An echocardiogram is a procedure that uses  painless sound waves (ultrasound) to produce an image of the heart.  Images from an echocardiogram can provide important information about the size and shape of your heart, heart muscle function, heart valve function, and fluid buildup around your heart.  You do not need to do  anything to prepare before this procedure. You may eat and drink normally.  After the echocardiogram is completed, you may return to your normal, everyday life, unless your health care provider tells you not to do that. This information is not intended to replace advice given to you by your health care provider. Make sure you discuss any questions you have with your health care provider. Document Released: 11/13/2000 Document Revised: 12/19/2016 Document Reviewed: 12/19/2016 Elsevier Interactive Patient Education  2019 [employer]   Ambulatory Cardiac Monitoring An ambulatory cardiac monitor is a small recording device that is used to detect abnormal heart rhythms (arrhythmias). Most monitors are connected by wires to flat, sticky disks (electrodes) that are then attached to your chest. You may need to wear a monitor if you have had symptoms such as:  Fast heartbeats (palpitations).  Dizziness.  Fainting or light-headedness.  Unexplained weakness.  Shortness of breath. There are several types of monitors. Some common monitors include:  Holter monitor. This records your heart rhythm continuously, usually for 24-48 hours.  Event (episodic) monitor. This monitor has a symptoms button, and when pushed, it will begin recording. You need to activate this monitor to record when you have a heart-related symptom.  Automatic detection monitor. This monitor will begin recording when it detects an abnormal heartbeat. What are the risks? Generally, these devices are safe to use. However, it is possible that the skin under the electrodes will become irritated. How to prepare for monitoring Your health care provider will prepare your chest for the electrode placement and show you how to use the monitor.  Do not apply lotions to your chest before monitoring.  Follow directions on how to care for the monitor, and how to return the monitor when the testing period is complete. How to use your  cardiac monitor  Follow directions about how long to wear the monitor, and if you can take the monitor off in order to shower or bathe. ? Do not let the monitor get wet. ? Do not bathe, swim, or use a hot tub while wearing the monitor.  Keep your skin clean. Do not put body lotion or moisturizer on your chest.  Change the electrodes as told by your health care provider, or any time they stop sticking to your skin. You may need to use medical tape to keep them on.  Try to put the electrodes in slightly different places on your chest to help prevent skin irritation. Follow directions from your health care provider about where to place the electrodes.  Make sure the monitor is safely clipped to your clothing or in a location close to your body as recommended by your health care provider.  If your monitor has a symptoms button, press the button to mark an event as soon as you feel a heart-related symptom, such as: ? Dizziness. ? Weakness. ? Light-headedness. ? Palpitations. ? Thumping or pounding in your chest. ? Shortness of breath. ? Unexplained weakness.  Keep a diary of your activities, such as walking, doing chores, and taking medicine. It is very important to note what you were doing when you pushed the button to record your symptoms. This will help your health care provider determine what might  be contributing to your symptoms.  Send the recorded information as recommended by your health care provider. It may take some time for your health care provider to process the results.  Change the batteries as told by your health care provider.  Keep electronic devices away from your monitor. These include: ? Tablets. ? MP3 players. ? Cell phones.  While wearing your monitor you should avoid: ? Electric blankets. ? Armed forces operational officer. ? Electric toothbrushes. ? Microwave ovens. ? Magnets. ? Metal detectors. Get help right away if:  You have chest pain.  You have shortness of  breath or extreme difficulty breathing.  You develop a very fast heartbeat that does not get better.  You develop dizziness that does not go away.  You faint or constantly feel like you are about to faint. Summary  An ambulatory cardiac monitor is a small recording device that is used to detect abnormal heart rhythms (arrhythmias).  Make sure you understand how to send the information from the monitor to your health care provider.  It is important to press the button on the monitor when you have any heart-related symptoms.  Keep a diary of your activities, such as walking, doing chores, and taking medicine. It is very important to note what you were doing when you pushed the button to record your symptoms. This will help your health care provider learn what might be causing your symptoms. This information is not intended to replace advice given to you by your health care provider. Make sure you discuss any questions you have with your health care provider. Document Released: 08/25/2008 Document Revised: 09/01/2017 Document Reviewed: 10/31/2016 Elsevier Interactive Patient Education  2019 [employer].

## 2019-05-24 NOTE — Telephone Encounter (Signed)
Yes agree with trying Xyzal in exchange for the Claritin.   She can try OTC Pataday 0.2% or if insurance covers can prescribe Pataday 0.7% which both would be stronger than patanol.    Yes please make a letter as below: ----------------------------------------------------------------------   To whom it may concern,    (patient name) is a patient of mine at the [facility].  Please allow for (patient name) to wear a disposable surgical face mask while at work.    Please call our office if any questions.   Sincerely,     Prudy Feeler, MD [facility]

## 2019-05-24 NOTE — Telephone Encounter (Signed)
Enrolled patient for a 30 Day Preventice Event Monitor to be mailed. Brief instructions were gone over with the patient and she knows to expect the monitor to arrive in 3-4 days 

## 2019-05-24 NOTE — Progress Notes (Signed)
Virtual Visit via Video Note   This visit type was conducted due to national recommendations for restrictions regarding the COVID-19 Pandemic (e.g. social distancing) in an effort to limit this patient's exposure and mitigate transmission in our community.  Due to her co-morbid illnesses, this patient is at least at moderate risk for complications without adequate follow up.  This format is felt to be most appropriate for this patient at this time.  All issues noted in this document were discussed and addressed.  A limited physical exam was performed with this format.  Please refer to the patient's chart for her consent to telehealth for [facility].   Date:  05/24/2019   ID:  Patricia Harvey, DOB Aug 26, 1991, MRN [id number]  Patient Location: Home Provider Location: Office  PCP:  Caren Macadam, MD  Cardiologist:  No primary care provider on file.  Electrophysiologist:  None   Evaluation Performed:  New Patient Evaluation  Chief Complaint:  Dizziness  History of Present Illness:    Patricia Harvey is a 28 y.o. female with esoinophilic eosphagitis, anxiety, posttraumatic stress disorder, idiopathic hypersomnia, vertigo, fatigue.  She presents today for evaluation of dizziness.  Patient is quite tearful at the start of our visit and feels exasperated with her symptoms.  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  She has difficulty wearing the required mask at work for both her and patient protection.  She has discussed this issue with her allergist and I have encouraged her to continue to address this issue with her primary care provider as she is seeking documentation that she cannot wear a mask.  Given that she is a new cardiovascular patient and we have no objective data, this would be best addressed by a physician who knows her already.  She understands.  She has had longstanding symptoms per her report of  dizziness, which she feels is postural.  She is concerned that she has dysautonomia.  She thinks her symptoms have increased over the past several months and she notes vasovagal episodes 1-2 times a week now.  She is able to abort syncope by lying down or relaxing, and has not recently had a syncopal episode, but does note previous episodes of fainting.  Denies concomitant chest pain.  She does endorse shortness of breath with these episodes.  She endorses palpitations.  Denies PND, orthopnea.  No significant leg swelling.  No family history of early MI or sudden cardiac death.  The patient does not have symptoms concerning for COVID-19 infection (fever, chills, cough, or new shortness of breath).    Past Medical History:  Diagnosis Date  . Environmental allergies   . Eosinophilic esophagitis   . IBS (irritable bowel syndrome)    Past Surgical History:  Procedure Laterality Date  . COLONOSCOPY    . TONSILLECTOMY  2004  . WISDOM TOOTH EXTRACTION       Current Meds  Medication Sig  . albuterol (VENTOLIN HFA) 108 (90 Base) MCG/ACT inhaler Inhale 2 puffs into the lungs every 4 (four) hours as needed for wheezing or shortness of breath.  . Amphetamine Sulfate (EVEKEO) 10 MG TABS Evekeo 10 mg tablet  . Armodafinil 150 MG tablet   . azelastine (ASTELIN) 0.1 % nasal spray Place into the nose.  . Bromelains 500 MG TABS Take by mouth.  . fexofenadine (ALLEGRA) 180 MG tablet Take by mouth.  . fluticasone (FLONASE) 50 MCG/ACT nasal spray 2 sprays by  Each Nare route daily.  Marland Kitchen. ibuprofen (ADVIL,MOTRIN) 600 MG tablet Take 1 tablet (600 mg total) by mouth every 6 (six) hours as needed.  Marland Kitchen. ipratropium (ATROVENT) 0.06 % nasal spray Place 2 sprays into both nostrils 3 (three) times daily as needed for rhinitis.  Marland Kitchen. loratadine (CLARITIN) 10 MG tablet Take 10 mg by mouth daily.  . montelukast (SINGULAIR) 10 MG tablet Take 1 tablet (10 mg total) by mouth at bedtime.  Marland Kitchen. olopatadine (PATANOL) 0.1 % ophthalmic  solution Place 1 drop into both eyes 2 (two) times daily.  Marland Kitchen. saccharomyces boulardii (FLORASTOR) 250 MG capsule Take 250 mg by mouth daily.  . sertraline (ZOLOFT) 100 MG tablet   . TURMERIC PO Take 1 tablet by mouth daily.     Allergies:   Milk-related compounds, Sesame seed (diagnostic), and Wheat bran   Social History   Tobacco Use  . Smoking status: Never Smoker  . Smokeless tobacco: Never Used  Substance Use Topics  . Alcohol use: No  . Drug use: No     Family Hx: The patient's family history includes Allergic rhinitis in her mother, paternal grandfather, and paternal grandmother. There is no history of Eczema, Urticaria, Asthma, or Angioedema.  ROS:   Please see the history of present illness.     All other systems reviewed and are negative.   Prior CV studies:   The following studies were reviewed today:    Labs/Other Tests and Data Reviewed:    EKG:  No ECG reviewed.  Recent Labs: No results found for requested labs within last 8760 hours.   Recent Lipid Panel No results found for: CHOL, TRIG, HDL, CHOLHDL, LDLCALC, LDLDIRECT  Wt Readings from Last 3 Encounters:  05/24/19 129 lb (58.5 kg)  05/06/18 129 lb (58.5 kg)  02/01/18 128 lb (58.1 kg)     Objective:    Vital Signs:  BP (!) 90/52 Comment: taken 1 hour ago  Pulse 92   Ht 5' 2.5" (1.588 m)   Wt 129 lb (58.5 kg)   BMI 23.22 kg/m    VITAL SIGNS:  reviewed GEN:  no acute distress EYES:  sclerae anicteric, EOMI - Extraocular Movements Intact RESPIRATORY:  normal respiratory effort, symmetric expansion CARDIOVASCULAR:  no peripheral edema SKIN:  no rash, lesions or ulcers. MUSCULOSKELETAL:  no obvious deformities. NEURO:  alert and oriented x 3, no obvious focal deficit PSYCH:  tearful and anxious  ASSESSMENT & PLAN:    1. Syncope and collapse   2. Dizziness   3. Palpitations    Syncope/palpitations-I am concerned about her symptoms of syncope and presyncope in the setting of dizziness  and palpitations.  We cannot exclude a malignant arrhythmia without objective testing.  I believe we will capture an episode on a 30-day live event monitor.  I expressed to the patient that this would be important so that if there is a malignant arrhythmia we will capture it in real-time and a physician or APP will be in contact with the patient right away to ensure she is okay.  She was comforted by this fact and would like to proceed with event monitor.  I would also like to obtain an echocardiogram to rule out any congenital heart disease in this young healthy female, as well as any unexpected structural issues that may contribute to syncope such as outflow tract obstruction or myocardial issues.  Patient endorses positional symptoms of dizziness and presyncope, which may be neurocardiogenic in nature.  We have discussed today lifestyle factors  to abort syncope including strategies for venous return, lying down, liberalizing salt intake, and adequate hydration.  And I have recommended not to drive if she has had syncope in the past 6 months.  Labile blood pressures- the patient notes that occasionally her blood pressure will be high and at times it is low.  She is unsure whether she should be on antihypertensive therapy or medication to support her blood pressure.  At this time I do not feel either is necessary, we will obtain objective testing including event monitor and echocardiogram to evaluate for structural issues, and determine what may be the best therapy for her.  Supportive conservative care is likely the best strategy at this time.  COVID-19 Education: The signs and symptoms of COVID-19 were discussed with the patient and how to seek care for testing (follow up with PCP or arrange E-visit).  The importance of social distancing was discussed today.  Time:   Today, I have spent 50 minutes with the patient with telehealth technology discussing the above problems.     Medication  Adjustments/Labs and Tests Ordered: Current medicines are reviewed at length with the patient today.  Concerns regarding medicines are outlined above.   Tests Ordered: No orders of the defined types were placed in this encounter.   Medication Changes: No orders of the defined types were placed in this encounter.   Follow Up:  Virtual Visit prn results of testing.  Signed, Parke PoissonGayatri A Khayden Herzberg, MD  05/24/2019 2:50 PM    [facility]

## 2019-05-24 NOTE — Telephone Encounter (Signed)
Letter  

## 2019-05-24 NOTE — Telephone Encounter (Signed)
Left message to call back and go over AVS from visit today

## 2019-05-25 ENCOUNTER — Telehealth: Payer: Self-pay

## 2019-05-25 ENCOUNTER — Ambulatory Visit (INDEPENDENT_AMBULATORY_CARE_PROVIDER_SITE_OTHER): Payer: 59 | Admitting: Allergy

## 2019-05-25 ENCOUNTER — Encounter: Payer: Self-pay | Admitting: Allergy

## 2019-05-25 ENCOUNTER — Other Ambulatory Visit: Payer: Self-pay

## 2019-05-25 DIAGNOSIS — H1010 Acute atopic conjunctivitis, unspecified eye: Secondary | ICD-10-CM

## 2019-05-25 DIAGNOSIS — R490 Dysphonia: Secondary | ICD-10-CM

## 2019-05-25 DIAGNOSIS — R42 Dizziness and giddiness: Secondary | ICD-10-CM

## 2019-05-25 DIAGNOSIS — J4540 Moderate persistent asthma, uncomplicated: Secondary | ICD-10-CM

## 2019-05-25 DIAGNOSIS — J383 Other diseases of vocal cords: Secondary | ICD-10-CM

## 2019-05-25 DIAGNOSIS — J309 Allergic rhinitis, unspecified: Secondary | ICD-10-CM

## 2019-05-25 DIAGNOSIS — K200 Eosinophilic esophagitis: Secondary | ICD-10-CM | POA: Diagnosis not present

## 2019-05-25 MED ORDER — ALBUTEROL SULFATE HFA 108 (90 BASE) MCG/ACT IN AERS: 2.0000 | INHALATION_SPRAY | RESPIRATORY_TRACT | 2 refills | Status: DC | PRN

## 2019-05-25 MED ORDER — BUDESONIDE-FORMOTEROL FUMARATE 160-4.5 MCG/ACT IN AERO: 2.0000 | INHALATION_SPRAY | Freq: Two times a day (BID) | RESPIRATORY_TRACT | 5 refills | Status: DC

## 2019-05-25 NOTE — Telephone Encounter (Signed)
Pt is calling today asking for a letter saying that she has a medical condition.  Is having a hard time breathing.  Unable to take a deep breath when she is talking.  Pt also states that she think she may have or may have had covid 19 and is still having issues since being sick in April.  Pt is asking for a letter for her job, they want to know that she has issues with her breathing and EOE.  Offered a phone visit with Dr Nelva Bush today, she wanted to talk to her directly.

## 2019-05-25 NOTE — Telephone Encounter (Signed)
Ok thanks.    I did create a letter yesterday for her with recommendation that she wear the disposable mask over the cloth mask since the cloth mask were causing issues.   Did she receive that letter?

## 2019-05-25 NOTE — Patient Instructions (Addendum)
Reactive airway disease, hoarseness, dizziness  - She has a constellation of symptoms including shortness of breath/trouble breathing, sore throat, hoarseness, dizziness that has been ongoing since April.  She does report a positive COVID exposure and did have COVID testing however it was done via antibody and only obtaining the IgG which was negative at the time.  It is possible that it was negative if the test was done before she may have developed IgG antibodies.  Thus will obtain IgG and IgM antibodies to determine if she potentially had a past infection (this could be if the IgG is now positive) where she potentially has current infection (this would be if the IgM is positive).    - We will have her start on Symbicort 160 mcg 2 puffs twice a day at this time to help with the shortness of breath  -  have access to albuterol inhaler 2 puffs every 4-6 hours as needed for cough/wheeze/shortness of breath/chest tightness.  May use 15-20 minutes prior to activity.   Monitor frequency of use.   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  I am hopeful that instituting Symbicort will help to relieve the respiratory symptoms so that the mask can be appropriately worn.  However until we have improvement in her symptoms recommend that she be allowed to wear a face shield and have proper distancing between herself and her clients (who is masked) to allow her to continue to perform her work duties as safely as possible.   Allergic rhinoconjunctivitis   - Continue avoidance measures   - Continue singulair, allegra and xyzal per your schedule   - Continue Pataday eye drops daily   - Continue allergen immunotherapy (allergy shots) per schedule  EoE  - continue current dietary avoidance for management of GI symptoms  Dysphonia   - Continue speech therapy sessions as directed   Follow-up 3-4 months for follow-up

## 2019-05-25 NOTE — Progress Notes (Signed)
RE: Patricia Harvey MRN: [id number] DOB: January 09, 1991 Date of Telemedicine Visit: 05/25/2019  Referring provider: Aliene BeamsHagler, Rachel, MD Primary care provider: Aliene BeamsHagler, Rachel, MD  Chief Complaint: Shortness of Breath (started may 1st, had antibody test it was negative. sore throat, dizziness, congestion, loss of voice)   Telemedicine Follow Up Visit via Telephone: I connected with Patricia Harvey for a follow up on 05/25/19 by telephone and verified that I am speaking with the correct person using two identifiers.   I discussed the limitations, risks, security and privacy concerns of performing an evaluation and management service by telephone and the availability of in person appointments. I also discussed with the patient that there may be a patient responsible charge related to this service. The patient expressed understanding and agreed to proceed.  Patient is at school.  Provider is at the office.  Visit start time: 1524 Visit end time: 1611 [facility] consent and medical assistant/nurse: Patricia Harvey  History of Present Illness: She is a 28 y.o. female, who is being followed for allergic rhinitis with conjunctivitis as well as eosinophilic esophagitis and vocal cord dysfunction. Her previous allergy office visit was on 1.30.2020 with Dr. Delorse LekPadgett.   She states around the end of April she had had positive COVID exposures and developed symptoms of trouble breathing, sore throat, hoarseness and states that she did go to her primary who had a COVID testing done by "[facility]".  The COVID testing done was for the IgG antibody test that was negative at that time.  She has continued to have symptoms ever since.  She states that it becomes quite difficult to breathe especially if she is needing to talk for prolonged periods of time.  She is a therapist.  She states it is even harder to breathe when she has to wear her face mask and states that even her diaphragm feels  like it is spasming when she has to wear her face mask and talk.  She also states that she is becoming very dizzy and quite lightheaded when she is having to wear a facemask and talk and or do other activities that require more physical work.  She states due to the shortness of breath that she has been having she has been using her albuterol inhaler pretty much on a daily basis.  She does get some immediate relief when she uses this but it is temporary.  She continues to work with the speech therapist in regards to her vocal cord/dysphonia and is utilizing all the techniques that she has learned through this therapy. She states that she is required to wear a mask at her job.  She states that she is able to perform her therapy duties in a room that allows for more than 6 feet apart between her and her client.  She also states that she has the capabilities to even work outside in a secure area also allows for at at least 6 feet distance. She is on allergen immunotherapy for her allergic rhinitis with conjunctivitis and is tolerating these injections well without any large local or systemic reactions.  I did advise her recently to exchange her Claritin for Xyzal as she did report that she was having more issues with her allergies.  Also advised that she could try over-the-counter Pataday to see if this is more effective than use of Patanol.  She recently saw Dr. Jacques NavyAcharya with cardiology and will be undergoing an echo and further cardiac studies for  issues of dizziness, lightheadedness and chest pains.  Assessment and Plan: Patricia DaftKayla is a 28 y.o. female with:   Reactive airway disease, hoarseness, dizziness  - She has a constellation of symptoms including shortness of breath/trouble breathing, sore throat, hoarseness, dizziness that has been ongoing since April.  She does report a positive COVID exposure and did have COVID testing however it was done via antibody and only obtaining the IgG which was negative at the  time.  It is possible that it was negative if the test was done before she may have developed IgG antibodies.  Thus will obtain IgG and IgM antibodies to determine if she potentially had a past infection (this could be if the IgG is now positive) where she potentially has current infection (this would be if the IgM is positive).    - We will have her start on Symbicort 160 mcg 2 puffs twice a day at this time to help with the shortness of breath  -  have access to albuterol inhaler 2 puffs every 4-6 hours as needed for cough/wheeze/shortness of breath/chest tightness.  May use 15-20 minutes prior to activity.   Monitor frequency of use.   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  I am hopeful that instituting Symbicort will help to relieve the respiratory symptoms so that the mask can be appropriately worn.  However until we have improvement in her symptoms recommend that she be allowed to wear a face shield and have proper distancing between herself and her clients (who is masked) to allow her to continue to perform her work duties as safely as possible.  Allergic rhinoconjunctivitis   - Continue avoidance measures   - Continue singulair, allegra and xyzal per your schedule   - Continue Pataday eye drops daily   - Continue allergen immunotherapy (allergy shots) per schedule  EoE  - continue current dietary avoidance for management of GI symptoms  Dysphonia   - Continue speech therapy sessions as directed   Follow-up 3-4 months for follow-up or sooner if needed  Diagnostics: None.  Medication List:  Current Outpatient Medications  Medication Sig Dispense Refill   albuterol (VENTOLIN HFA) 108 (90 Base) MCG/ACT inhaler Inhale 2 puffs into the lungs every 4 (four) hours as needed for wheezing or shortness of breath. 18 g 2   Amphetamine Sulfate (EVEKEO) 10 MG TABS Evekeo 10 mg tablet     Armodafinil 150 MG tablet       azelastine (ASTELIN) 0.1 % nasal spray Place into the nose.     Bromelains 500 MG TABS Take by mouth.     fexofenadine (ALLEGRA) 180 MG tablet Take by mouth.     fluticasone (FLONASE) 50 MCG/ACT nasal spray 2 sprays by Each Nare route daily.     ipratropium (ATROVENT) 0.06 % nasal spray Place 2 sprays into both nostrils 3 (three) times daily as needed for rhinitis. 15 mL 5   levocetirizine (XYZAL) 5 MG tablet Take 5 mg by mouth every evening.     montelukast (SINGULAIR) 10 MG tablet Take 1 tablet (10 mg total) by mouth at bedtime. 30 tablet 5   olopatadine (PATANOL) 0.1 % ophthalmic solution Place 1 drop into both eyes 2 (two) times daily. 5 mL 5   saccharomyces boulardii (FLORASTOR) 250 MG capsule Take 250 mg by mouth daily.     sertraline (ZOLOFT) 100 MG tablet      TURMERIC PO Take  1 tablet by mouth daily.     budesonide-formoterol (SYMBICORT) 160-4.5 MCG/ACT inhaler Inhale 2 puffs into the lungs 2 (two) times daily. 1 Inhaler 5   ibuprofen (ADVIL,MOTRIN) 600 MG tablet Take 1 tablet (600 mg total) by mouth every 6 (six) hours as needed. (Patient not taking: Reported on 05/25/2019) 30 tablet 0   loratadine (CLARITIN) 10 MG tablet Take 10 mg by mouth daily.     No current facility-administered medications for this visit.    Allergies: Allergies  Allergen Reactions   Milk-Related Compounds Diarrhea    Cramping   Sesame Seed (Diagnostic) Diarrhea, Nausea Only and Other (See Comments)    cramping   Wheat Bran Diarrhea    Cramping   I reviewed her past medical history, social history, family history, and environmental history and no significant changes have been reported from previous visit on December 29, 2018.  Review of Systems  Constitutional: Negative for chills and fever.  HENT: Positive for sore throat and voice change. Negative for congestion, postnasal drip, rhinorrhea, sinus pressure, sinus pain and sneezing.   Eyes: Positive for itching. Negative for  discharge and redness.  Respiratory: Positive for cough, chest tightness and shortness of breath.   Cardiovascular: Positive for chest pain.  Gastrointestinal: Positive for abdominal pain.  Musculoskeletal: Negative for arthralgias and myalgias.  Skin: Negative for rash.  Neurological: Positive for dizziness and light-headedness. Negative for syncope and headaches.   Objective: Physical Exam Not obtained as encounter was done via telephone.   Previous notes and tests were reviewed.  I discussed the assessment and treatment plan with the patient. The patient was provided an opportunity to ask questions and all were answered. The patient agreed with the plan and demonstrated an understanding of the instructions.   The patient was advised to call back or seek an in-person evaluation if the symptoms worsen or if the condition fails to improve as anticipated.  I provided 47 encounter.  It was my pleasure to participate in [place] care today. Please feel free to contact me with any questions or concerns.   Sincerely,  Cheryll Keisler Charmian Muff, MD

## 2019-05-25 NOTE — Telephone Encounter (Signed)
Had a telephone visit with the patient today. The requested letter (see telephone note from 05/24/2019) is not complete as she provided additional information today and will need to revise this letter. Also please print out a requisition form for the patient to have her labs done and provide with the letter below.  Labs already ordered. There is concern that she may have had COVID back in late April/May however testing at that point was negative by IgG antibody test thus I am repeating the antibody test now to determine if she potentially did have a COVID back then.  She remains symptomatic.    ----------------------------------------------------  Please make a new letter with the following:     To whom it may concern,   (Patient name) is a patient of mine at the [facility] [place] for management of eosinophilic esophagitis, allergic rhinitis with conjunctivitis as well as reactive airway disease related to her current illness.  The reactive airway disease can include symptoms of shortness of breath, cough, wheezing, chest tightness or pain that can require the need to use rescue inhaler medications for relief of symptoms.  These symptoms can also be exacerbated by a variety of different triggers and in her case this includes wearing a face mask while needing to perform her work duties which includes prolonged talking in her therapy sessions.  It is my recommendation that she be allowed to wear a face shield and have at least 6 feet distance or more if possible between herself and her client as well as have the client wear a face mask so that she may be able to properly perform her work duties as safely as possible. If you have any questions feel free to contact my office.  Sincerely,    Prudy Feeler, MD [facility]

## 2019-05-25 NOTE — Telephone Encounter (Signed)
She did not get this letter we were unable to reach her yesterday. A detailed message was left advising letter was ready to be pick up and placed up front per Ellen Henri. CMA note

## 2019-05-26 NOTE — Telephone Encounter (Signed)
Letter has been printed, will be put at front desk. Labs have been also put at the front desk.

## 2019-05-29 NOTE — Telephone Encounter (Signed)
Left message to call back  

## 2019-05-31 ENCOUNTER — Telehealth: Payer: Self-pay | Admitting: *Deleted

## 2019-05-31 NOTE — Telephone Encounter (Signed)
Thank you.   Yes her COVID antibody labs are not urgent.  Whenever she can have them done it will show if she has made the antibodies or not which can indicate past infection.     I am more than willing to discuss her allergy care with her other physicians however I do not know who all she sees and this should be set up by her PCP.   I can also provide a copy of my recent note with her other providers to keep them updated if she would like for this to be done.  Also if her other physicians are apart of [facility], [facility], [facility] or [facility] we all have access to their records for review.   Her PCP should be receiving an electronic copy of my notes after each visit as well which states current plan for her allergy care.

## 2019-05-31 NOTE — Telephone Encounter (Signed)
Patient called wants to get labs reconciliated with other physicians' lab orders as she states "she is tired of being stuck for blood work and procedures." She wants Dr Nelva Bush to talk with her other doctors so they can all be on the same page. Writer did call Dr Roma Kayser office at [facility] [place].   advised the patient wanted to get all labs drawn together and since they are pcp if they could add them and was advised they did not order outside labs that come from someone else . Advised patient as told by pcp also advised that we would not be able to order pcp labs due to [place] not being PCP. Patient states she will hold off on getting labs drawn at this time because she thinks her body cannot take it.  She would like for Dr Nelva Bush to communicate with her other physicians so they can all be on the same page.

## 2019-05-31 NOTE — Telephone Encounter (Signed)
Called patient and left voicemail to return call. ?

## 2019-05-31 NOTE — Telephone Encounter (Signed)
Patient never called back. AVS mailed to patient, Echo scheduled, and monitor being mailed to patient

## 2019-06-01 ENCOUNTER — Other Ambulatory Visit: Payer: Self-pay

## 2019-06-01 ENCOUNTER — Encounter (INDEPENDENT_AMBULATORY_CARE_PROVIDER_SITE_OTHER): Payer: 59

## 2019-06-01 ENCOUNTER — Ambulatory Visit (HOSPITAL_COMMUNITY): Payer: 59 | Attending: Cardiology

## 2019-06-01 ENCOUNTER — Ambulatory Visit (INDEPENDENT_AMBULATORY_CARE_PROVIDER_SITE_OTHER): Payer: 59 | Admitting: *Deleted

## 2019-06-01 DIAGNOSIS — R42 Dizziness and giddiness: Secondary | ICD-10-CM | POA: Diagnosis present

## 2019-06-01 DIAGNOSIS — R55 Syncope and collapse: Secondary | ICD-10-CM

## 2019-06-01 DIAGNOSIS — J309 Allergic rhinitis, unspecified: Secondary | ICD-10-CM | POA: Diagnosis not present

## 2019-06-01 DIAGNOSIS — R002 Palpitations: Secondary | ICD-10-CM

## 2019-06-01 NOTE — Telephone Encounter (Signed)
Called and left a voicemail asking to call back and return. Called patient and left a detail voicemail per [facility] permission explaining details from Dr. Jeralyn Ruths message. Did inform about the Hep C and HIV labs and that Dr. Nelva Bush did not place those labs that must have been her PCP and will need to be handled by them.

## 2019-06-01 NOTE — Telephone Encounter (Signed)
Please call [phone]

## 2019-06-01 NOTE — Telephone Encounter (Signed)
Patients husband called stating his wife was supposed to have more than just covid antibody testing labs put in. He mentioned HIV & HEP C labs.  Please Advise.

## 2019-06-06 ENCOUNTER — Ambulatory Visit (INDEPENDENT_AMBULATORY_CARE_PROVIDER_SITE_OTHER): Payer: 59 | Admitting: *Deleted

## 2019-06-06 DIAGNOSIS — J309 Allergic rhinitis, unspecified: Secondary | ICD-10-CM | POA: Diagnosis not present

## 2019-06-07 DIAGNOSIS — J3081 Allergic rhinitis due to animal (cat) (dog) hair and dander: Secondary | ICD-10-CM | POA: Diagnosis not present

## 2019-06-07 NOTE — Progress Notes (Signed)
VIAL EXP 06-06-2020 

## 2019-06-08 ENCOUNTER — Telehealth: Payer: Self-pay | Admitting: Internal Medicine

## 2019-06-08 NOTE — Telephone Encounter (Signed)
Patient was calling in regards to her Echo done on 07/02

## 2019-06-08 NOTE — Telephone Encounter (Signed)
Spoke w/pt she was wondering some directions for her monitor. She states that she was getting some problems getting some directions for the monitor, she has had it on for 6 days and just found out that she needs to push the button when she has any sx. She feels much better knowing what she is supposed to be doing. She would like to make an appt for f/u and to discuss these results. Appt scheduled to discuss

## 2019-06-09 DIAGNOSIS — J384 Edema of larynx: Secondary | ICD-10-CM | POA: Insufficient documentation

## 2019-06-16 ENCOUNTER — Ambulatory Visit (INDEPENDENT_AMBULATORY_CARE_PROVIDER_SITE_OTHER): Payer: 59 | Admitting: *Deleted

## 2019-06-16 DIAGNOSIS — J309 Allergic rhinitis, unspecified: Secondary | ICD-10-CM | POA: Diagnosis not present

## 2019-06-21 DIAGNOSIS — J301 Allergic rhinitis due to pollen: Secondary | ICD-10-CM | POA: Diagnosis not present

## 2019-06-21 NOTE — Progress Notes (Signed)
VIAL EXP 06-20-2020 

## 2019-06-28 ENCOUNTER — Telehealth: Payer: Self-pay | Admitting: *Deleted

## 2019-06-28 NOTE — Telephone Encounter (Signed)
Called to speak with patient.  Unable to leave message because mailbox was full.  Patient called office on Monday to check on when she had last allergy injection and also to speak with Dr. Nelva Bush.  Patient wants to discuss her disability papers that were filled out.  Per Dr. Nelva Bush, patient can set up office visit in person or televist to discuss papers and additional needs and can set up at time of allergy injection if that works best for patient.

## 2019-06-29 ENCOUNTER — Ambulatory Visit (INDEPENDENT_AMBULATORY_CARE_PROVIDER_SITE_OTHER): Payer: 59

## 2019-06-29 DIAGNOSIS — J309 Allergic rhinitis, unspecified: Secondary | ICD-10-CM | POA: Diagnosis not present

## 2019-06-29 NOTE — Telephone Encounter (Signed)
Patient only needs records sent to the disability company. I have given her a medical release form.

## 2019-07-10 ENCOUNTER — Telehealth: Payer: Self-pay | Admitting: *Deleted

## 2019-07-10 ENCOUNTER — Ambulatory Visit (INDEPENDENT_AMBULATORY_CARE_PROVIDER_SITE_OTHER): Payer: 59 | Admitting: *Deleted

## 2019-07-10 DIAGNOSIS — J309 Allergic rhinitis, unspecified: Secondary | ICD-10-CM | POA: Diagnosis not present

## 2019-07-10 NOTE — Telephone Encounter (Signed)
Patient came in today and received her allergy injections. Patient stated that she would like to mix her 2 allergen vials together, the POLLEN and CAT. Patient is about to start her 4th Red Pollen and has just started her 2nd Red Cat vial. Please advise when you would like for the Red vials to be combined. The patient strongly expressed that she does not want to keep receiving 2 injections and would like for them to be combined. It was not noted that the patient has started the 3rd red vial so she has been coming weekly at .50. To avoid confusion it has not been reflected yet to come every 2 weeks at .50 for the Pollen vial. Please advise.

## 2019-07-11 NOTE — Progress Notes (Signed)
VIAL EXP 07-10-2020 

## 2019-07-11 NOTE — Telephone Encounter (Signed)
Patient in on her second Red vial of Cat and did reach 0.50 without issue. Patient has a new vial of Pollen already mixed. Please send new script for Patricia Harvey to mix with next set of vials.

## 2019-07-11 NOTE — Telephone Encounter (Signed)
Has she reached Cat red vial 0.7ml yet?   The last injection I see if for the cat red vial was 0.45ml.       Once she is at Cat red vial 0.66ml like she is for the pollen then the vials can be mixed.    I am fine once the vials are mixed to continue with the protocol to spread out to every 2 weeks

## 2019-07-11 NOTE — Addendum Note (Signed)
Addended by: Theresia Lo on: 07/11/2019 10:47 AM   Modules accepted: Orders

## 2019-07-11 NOTE — Telephone Encounter (Signed)
Ordered new vial to include pollen and cat for when she is due to next vial set to be made.  Thanks.

## 2019-07-11 NOTE — Telephone Encounter (Signed)
Spoke with patient to inform her that we will be remixing her vials back to one vial next time we mix vials. Patient was unhappy that she was previously told this was not possible and wanted to speak with the office manager since we just made vials and she did not want to wait another couple months. Sent patient to discuss this issue with Johnette.

## 2019-07-13 ENCOUNTER — Ambulatory Visit: Payer: 59 | Admitting: Internal Medicine

## 2019-07-17 ENCOUNTER — Ambulatory Visit (INDEPENDENT_AMBULATORY_CARE_PROVIDER_SITE_OTHER): Payer: 59 | Admitting: *Deleted

## 2019-07-17 DIAGNOSIS — J309 Allergic rhinitis, unspecified: Secondary | ICD-10-CM | POA: Diagnosis not present

## 2019-07-21 NOTE — Telephone Encounter (Signed)
Yesterday I was made aware of the confusion with the new vials. I have spoken with Dr. Nelva Bush. I asked Marcie Bal to mix a new combined vial at no charge. Dr. Nelva Bush has been made aware of the situation and why I had this done.  Please make sure the separate vials are no longer used. Please make sure to give Lashante one injection from her combined vial going forward.  I have also sent the patient an apology e-mail. Please make sure you contact me directly if there is any more confusion about this patient or any patient going forward as we do not want patients care being delayed for any reason.  Thank you  Anner Crete, Practice Administrator

## 2019-07-21 NOTE — Telephone Encounter (Signed)
Replaced vials in the shot room.

## 2019-07-24 NOTE — Telephone Encounter (Signed)
I checked her account and she has not been charged. Thank you.

## 2019-07-25 ENCOUNTER — Ambulatory Visit (INDEPENDENT_AMBULATORY_CARE_PROVIDER_SITE_OTHER): Payer: 59 | Admitting: *Deleted

## 2019-07-25 DIAGNOSIS — J309 Allergic rhinitis, unspecified: Secondary | ICD-10-CM

## 2019-07-31 NOTE — Progress Notes (Signed)
Cardiology Clinic Note   Patient Name: Patricia Harvey Date of Encounter: 08/01/2019  Primary Care Provider:  Aliene BeamsHagler, Rachel, MD Primary Cardiologist:  Parke PoissonGayatri A Acharya, MD  Patient Profile    Patricia Harvey 28 year old female presents for follow-up of her dizziness.  Past Medical History    Past Medical History:  Diagnosis Date   Environmental allergies    Eosinophilic esophagitis    IBS (irritable bowel syndrome)    Past Surgical History:  Procedure Laterality Date   COLONOSCOPY     TONSILLECTOMY  2004   WISDOM TOOTH EXTRACTION      Allergies  Allergies  Allergen Reactions   Milk-Related Compounds Diarrhea    Cramping   Prazosin     Syncope   Sesame Seed (Diagnostic) Diarrhea, Nausea Only and Other (See Comments)    cramping   Wheat Bran Diarrhea    Cramping    History of Present Illness    Patricia Harvey was last seen by Dr. Jacques NavyAcharya on 05/24/2019 via virtual visit.  During that time she was reporting longstanding symptoms of dizziness.  She felt this was postural and was concerned she might have dysautonomia.  She felt her symptoms had increased over the past several months and noted vasovagal episodes 1-2 times during the period of a week.  She found relief from the symptoms by laying down or relaxing.  She also noted that she had previous events of fainting.  An echocardiogram on 06/01/2019 showed normal findings. A 30 day event monitor showed sinus rhythm with sinus arrhythmia and sinus tachycardia.   Her PMH also includes esophagitis, anxiety, posttraumatic stress disorder, idiopathic hypersomnia, vertigo, and fatigue.  She presents to the clinic today and states she is feeling better since June.  However, she states she has been dealing with dizziness when she moves from sitting to standing position for the last 4 to 5 years.  She states that she had an upper respiratory infection that was causing her to be short of breath and she noticed her dizziness was also worse  during that time.  She states she is maintaining hydration, taking electrolyte supplements, and has only had one episode where she felt like she might faint in the last month.  She also states she takes meclizine over-the-counter which helps somewhat with the dizziness.  She denies chest pain, shortness of breath, lower extremity edema, fatigue, palpitations, melena, hematuria, hemoptysis, diaphoresis, weakness, presyncope, syncope, orthopnea, and PND.   Home Medications    Prior to Admission medications   Medication Sig Start Date End Date Taking? Authorizing Provider  albuterol (VENTOLIN HFA) 108 (90 Base) MCG/ACT inhaler Inhale 2 puffs into the lungs every 4 (four) hours as needed for wheezing or shortness of breath. 05/25/19   Padgett, Pilar GrammesShaylar Patricia, MD  Amphetamine Sulfate (EVEKEO) 10 MG TABS Evekeo 10 mg tablet 08/02/18   [provider]  Armodafinil 150 MG tablet  12/18/18   [provider]  azelastine (ASTELIN) 0.1 % nasal spray Place into the nose. 03/25/17   [provider]  Bromelains 500 MG TABS Take by mouth.    [provider]  budesonide-formoterol (SYMBICORT) 160-4.5 MCG/ACT inhaler Inhale 2 puffs into the lungs 2 (two) times daily. 05/25/19   Marcelyn BruinsPadgett, Shaylar Patricia, MD  fexofenadine (ALLEGRA) 180 MG tablet Take by mouth.    [provider]  fluticasone (FLONASE) 50 MCG/ACT nasal spray 2 sprays by Each Nare route daily. 03/25/17   [provider]  ibuprofen (ADVIL,MOTRIN) 600 MG tablet Take  1 tablet (600 mg total) by mouth every 6 (six) hours as needed. Patient not taking: Reported on 05/25/2019 05/22/16   Antonietta Breach, PA-C  ipratropium (ATROVENT) 0.06 % nasal spray Place 2 sprays into both nostrils 3 (three) times daily as needed for rhinitis. 09/20/18   Kennith Gain, MD  levocetirizine (XYZAL) 5 MG tablet Take 5 mg by mouth every evening.    [provider]  loratadine (CLARITIN) 10 MG tablet Take 10 mg  by mouth daily.    [provider]  montelukast (SINGULAIR) 10 MG tablet Take 1 tablet (10 mg total) by mouth at bedtime. 05/06/18   Kennith Gain, MD  olopatadine (PATANOL) 0.1 % ophthalmic solution Place 1 drop into both eyes 2 (two) times daily. 05/06/18   Kennith Gain, MD  saccharomyces boulardii (FLORASTOR) 250 MG capsule Take 250 mg by mouth daily.    [provider]  sertraline (ZOLOFT) 100 MG tablet  11/17/18   [provider]  TURMERIC PO Take 1 tablet by mouth daily.    [provider]    Family History    Family History  Problem Relation Age of Onset   Allergic rhinitis Mother    Allergic rhinitis Paternal Grandmother    Allergic rhinitis Paternal Grandfather    Eczema Neg Hx    Urticaria Neg Hx    Asthma Neg Hx    Angioedema Neg Hx    She indicated that her mother is alive. She indicated that her paternal grandmother is alive. She indicated that her paternal grandfather is alive. She indicated that the status of her neg hx is unknown.  Social History    Social History   Socioeconomic History   Marital status: Married    Spouse name: Not on file   Number of children: Not on file   Years of education: Not on file   Highest education level: Not on file  Occupational History   Not on file  Social Needs   Financial resource strain: Not on file   Food insecurity    Worry: Not on file    Inability: Not on file   Transportation needs    Medical: Not on file    Non-medical: Not on file  Tobacco Use   Smoking status: Never Smoker   Smokeless tobacco: Never Used  Substance and Sexual Activity   Alcohol use: No   Drug use: No   Sexual activity: Not on file  Lifestyle   Physical activity    Days per week: Not on file    Minutes per session: Not on file   Stress: Not on file  Relationships   Social connections    Talks on phone: Not on file    Gets together: Not on file    Attends  religious service: Not on file    Active member of club or organization: Not on file    Attends meetings of clubs or organizations: Not on file    Relationship status: Not on file   Intimate partner violence    Fear of current or ex partner: Not on file    Emotionally abused: Not on file    Physically abused: Not on file    Forced sexual activity: Not on file  Other Topics Concern   Not on file  Social History Narrative   Not on file     Review of Systems    General:  No chills, fever, night sweats or weight changes.  Cardiovascular:  No chest pain, dyspnea on exertion, edema, orthopnea, palpitations, paroxysmal nocturnal dyspnea. Dermatological: No rash, lesions/masses Respiratory: No cough, dyspnea Urologic: No hematuria, dysuria Abdominal:   No nausea, vomiting, diarrhea, bright red blood per rectum, melena, or hematemesis Neurologic:  No visual changes, wkns, changes in mental status. All other systems reviewed and are otherwise negative except as noted above.  Physical Exam    VS:  BP 124/80 (BP Location: Right Arm)    Pulse 85    Ht 5\' 3"  (1.6 m)    Wt 130 lb 3.2 oz (59.1 kg)    BMI 23.06 kg/m  , BMI Body mass index is 23.06 kg/m. GEN: Well nourished, well developed, in no acute distress. HEENT: normal. Neck: Supple, no JVD, carotid bruits, or masses. Cardiac: RRR, no murmurs, rubs, or gallops. No clubbing, cyanosis, edema.  Radials/DP/PT 2+ and equal bilaterally.  Respiratory:  Respirations regular and unlabored, clear to auscultation bilaterally. GI: Soft, nontender, nondistended, BS + x 4. MS: no deformity or atrophy. Skin: warm and dry, no rash. Neuro:  Strength and sensation are intact. Psych: Normal affect.  Accessory Clinical Findings    ECG personally reviewed by me today- none today   Echocardiogram 06/01/2019  1. The left ventricle has normal systolic function with an ejection fraction of 60-65%. The cavity size was normal. Left ventricular diastolic  parameters were normal. No evidence of left ventricular regional wall motion abnormalities.  2. The right ventricle has normal systolic function. The cavity was normal. There is no increase in right ventricular wall thickness. Right ventricular systolic pressure is normal with an estimated pressure of 25.2 mmHg.  30-day cardiac event monitor 07/12/2019  Diary events showed sinus rhythm with sinus arrhythmia and sinus tachycardia.  Minimal heart rate 59, maximum heart rate 157.  Supraventricular ectopy less than 1%, ventricular ectopy less than 1%.  No SVT, ventricular tachycardia, pauses, AV block, or atrial fibrillation. Assessment & Plan   1.  Syncope-has only had one episode of presyncope in the last month.  She finds that she feels better now that she is over what upper respiratory illness she had in the spring. Continue p.o. hydration Continue electrolyte supplementation Increase cardiovascular activities as tolerated Support stockings  2.  Dizziness- her dizziness has become less pronounced.  However, she notes that it is still present with moving from a sitting to standing position.  She also notices that increased electrolytes have helped with the symptoms.  Orthostatics negative.  Heart rate stable throughout all 4 B/P measurements Continue p.o. hydration Continue electrolyte supplementation Increase cardiovascular activities as tolerated Use support stockings Move from the sitting to standing position slowly  3.  Palpitations- RRR, 30-day event monitor showed sinus rhythm sinus arrhythmia and sinus tachycardia.  No arrhythmias, atrial fibrillation, pauses or blocks noted.  Disposition: Follow-up with Dr. Jacques Navy in 6 months and as needed  Ronney Asters, NP-C 08/01/2019, 9:48 AM

## 2019-08-01 ENCOUNTER — Encounter: Payer: Self-pay | Admitting: Adult Health

## 2019-08-01 ENCOUNTER — Ambulatory Visit (INDEPENDENT_AMBULATORY_CARE_PROVIDER_SITE_OTHER): Payer: 59 | Admitting: *Deleted

## 2019-08-01 ENCOUNTER — Telehealth: Payer: Self-pay | Admitting: Adult Health

## 2019-08-01 ENCOUNTER — Other Ambulatory Visit: Payer: Self-pay

## 2019-08-01 ENCOUNTER — Ambulatory Visit: Payer: 59 | Admitting: General Practice

## 2019-08-01 VITALS — BP 124/80 | HR 85 | Ht 63.0 in | Wt 130.2 lb

## 2019-08-01 DIAGNOSIS — J309 Allergic rhinitis, unspecified: Secondary | ICD-10-CM | POA: Diagnosis not present

## 2019-08-01 DIAGNOSIS — R002 Palpitations: Secondary | ICD-10-CM

## 2019-08-01 DIAGNOSIS — R55 Syncope and collapse: Secondary | ICD-10-CM

## 2019-08-01 DIAGNOSIS — R42 Dizziness and giddiness: Secondary | ICD-10-CM

## 2019-08-01 NOTE — Patient Instructions (Signed)
Medication Instructions:  Continue current medications  If you need a refill on your cardiac medications before your next appointment, please call your pharmacy.  Labwork: None Ordered   Testing/Procedures: None Ordered   Follow-Up: You will need a follow up appointment in 6 months.  Please call our office 2 months in advance to schedule this appointment.  You may see Gayatri A Acharya, MD or one of the following Advanced Practice Providers on your designated Care Team:   Rhonda Barrett, PA-C . Kathryn Lawrence, DNP, ANP     At [facility], you and your health needs are our priority.  As part of our continuing mission to provide you with exceptional heart care, we have created designated Provider Care Teams.  These Care Teams include your primary Cardiologist (physician) and Advanced Practice Providers (APPs -  Physician Assistants and Nurse Practitioners) who all work together to provide you with the care you need, when you need it.  Thank you for choosing [facility] at [facility]!!     

## 2019-08-01 NOTE — Telephone Encounter (Signed)
Call pt back to let her know letter for work was placed at front desk for pick up, pt voicemail box is full, will try again later.

## 2019-08-01 NOTE — Telephone Encounter (Signed)
Note for work, patient seen today-

## 2019-08-01 NOTE — Telephone Encounter (Signed)
New message   Patient states that she needs a note written showing that she was in the office for work. Please call.

## 2019-08-02 NOTE — Telephone Encounter (Signed)
Patricia Harvey. Can you give her a work note for yesterday 08/01/2019. Thanks

## 2019-08-02 NOTE — Telephone Encounter (Signed)
Hi Malone,   Thank you for reaching out with your concerns and questions. I was forwarded your message because Dr. Margaretann Loveless is out of the office. All of her messages will be sent out to other providers while she is away on maternity leave.  I presented your concerns to another advanced practice provider and a physician that are in the office with me today. We discuss your 30 day event monitor and echocardiogram results as well as you exam from yesterday where we did the orthostatic blood pressures while monitoring your heart rate. They agree with the course of management that we talked about yesterday. Continue to stay hydrated, take electrolytes, start wearing compression stockings and begin cardiovascular aerobic exercise to increase your vagal tone (recumbent biking or stationary biking are good options with your dizziness).I also brought forward your concerns about the tilt table test. This test is viewed as a test that is appropriate for individuals that are having multiple syncopal/ fainting events. At this time collectively we feel that this test should not be considered for you. Also, the tilt table test makes participants feel very ill and would not change your current management. Please let me know if you have any questions.  Sincerely,  Coletta Memos NP-C   I have included Dr Buford Dresser and Jory Sims DNP on this message as well.

## 2019-08-08 ENCOUNTER — Ambulatory Visit (INDEPENDENT_AMBULATORY_CARE_PROVIDER_SITE_OTHER): Payer: 59 | Admitting: *Deleted

## 2019-08-08 DIAGNOSIS — J309 Allergic rhinitis, unspecified: Secondary | ICD-10-CM

## 2019-08-15 ENCOUNTER — Ambulatory Visit (INDEPENDENT_AMBULATORY_CARE_PROVIDER_SITE_OTHER): Payer: 59 | Admitting: *Deleted

## 2019-08-15 DIAGNOSIS — J309 Allergic rhinitis, unspecified: Secondary | ICD-10-CM

## 2019-08-22 ENCOUNTER — Ambulatory Visit (INDEPENDENT_AMBULATORY_CARE_PROVIDER_SITE_OTHER): Payer: 59 | Admitting: *Deleted

## 2019-08-22 DIAGNOSIS — J309 Allergic rhinitis, unspecified: Secondary | ICD-10-CM

## 2019-08-30 ENCOUNTER — Ambulatory Visit (INDEPENDENT_AMBULATORY_CARE_PROVIDER_SITE_OTHER): Payer: 59

## 2019-08-30 DIAGNOSIS — J309 Allergic rhinitis, unspecified: Secondary | ICD-10-CM | POA: Diagnosis not present

## 2019-09-05 ENCOUNTER — Ambulatory Visit (INDEPENDENT_AMBULATORY_CARE_PROVIDER_SITE_OTHER): Payer: Self-pay

## 2019-09-05 DIAGNOSIS — J309 Allergic rhinitis, unspecified: Secondary | ICD-10-CM | POA: Diagnosis not present

## 2019-09-13 ENCOUNTER — Ambulatory Visit (INDEPENDENT_AMBULATORY_CARE_PROVIDER_SITE_OTHER): Payer: Self-pay

## 2019-09-13 DIAGNOSIS — J309 Allergic rhinitis, unspecified: Secondary | ICD-10-CM | POA: Diagnosis not present

## 2019-09-20 ENCOUNTER — Ambulatory Visit (INDEPENDENT_AMBULATORY_CARE_PROVIDER_SITE_OTHER): Payer: Self-pay | Admitting: *Deleted

## 2019-09-20 DIAGNOSIS — J309 Allergic rhinitis, unspecified: Secondary | ICD-10-CM | POA: Diagnosis not present

## 2019-09-20 NOTE — Progress Notes (Signed)
Vial exp 09-19-20 

## 2019-09-21 DIAGNOSIS — J3081 Allergic rhinitis due to animal (cat) (dog) hair and dander: Secondary | ICD-10-CM | POA: Diagnosis not present

## 2019-09-28 ENCOUNTER — Ambulatory Visit (INDEPENDENT_AMBULATORY_CARE_PROVIDER_SITE_OTHER): Payer: Self-pay

## 2019-09-28 DIAGNOSIS — J309 Allergic rhinitis, unspecified: Secondary | ICD-10-CM | POA: Diagnosis not present

## 2019-10-04 ENCOUNTER — Ambulatory Visit (INDEPENDENT_AMBULATORY_CARE_PROVIDER_SITE_OTHER): Payer: Self-pay | Admitting: *Deleted

## 2019-10-04 DIAGNOSIS — J309 Allergic rhinitis, unspecified: Secondary | ICD-10-CM

## 2019-10-13 ENCOUNTER — Ambulatory Visit (INDEPENDENT_AMBULATORY_CARE_PROVIDER_SITE_OTHER): Payer: Self-pay

## 2019-10-13 DIAGNOSIS — J309 Allergic rhinitis, unspecified: Secondary | ICD-10-CM

## 2019-10-17 ENCOUNTER — Ambulatory Visit (INDEPENDENT_AMBULATORY_CARE_PROVIDER_SITE_OTHER): Payer: Self-pay | Admitting: *Deleted

## 2019-10-17 DIAGNOSIS — J309 Allergic rhinitis, unspecified: Secondary | ICD-10-CM

## 2019-10-24 ENCOUNTER — Ambulatory Visit (INDEPENDENT_AMBULATORY_CARE_PROVIDER_SITE_OTHER): Payer: Self-pay

## 2019-10-24 DIAGNOSIS — J309 Allergic rhinitis, unspecified: Secondary | ICD-10-CM

## 2019-10-31 ENCOUNTER — Ambulatory Visit (INDEPENDENT_AMBULATORY_CARE_PROVIDER_SITE_OTHER): Payer: Self-pay | Admitting: *Deleted

## 2019-10-31 DIAGNOSIS — J309 Allergic rhinitis, unspecified: Secondary | ICD-10-CM

## 2019-11-06 ENCOUNTER — Ambulatory Visit (INDEPENDENT_AMBULATORY_CARE_PROVIDER_SITE_OTHER): Payer: Self-pay | Admitting: *Deleted

## 2019-11-06 DIAGNOSIS — J309 Allergic rhinitis, unspecified: Secondary | ICD-10-CM

## 2019-11-21 ENCOUNTER — Ambulatory Visit (INDEPENDENT_AMBULATORY_CARE_PROVIDER_SITE_OTHER): Payer: Self-pay

## 2019-11-21 DIAGNOSIS — J309 Allergic rhinitis, unspecified: Secondary | ICD-10-CM

## 2019-12-06 ENCOUNTER — Ambulatory Visit (INDEPENDENT_AMBULATORY_CARE_PROVIDER_SITE_OTHER): Payer: No Typology Code available for payment source | Admitting: *Deleted

## 2019-12-06 DIAGNOSIS — J309 Allergic rhinitis, unspecified: Secondary | ICD-10-CM | POA: Diagnosis not present

## 2019-12-11 ENCOUNTER — Ambulatory Visit (INDEPENDENT_AMBULATORY_CARE_PROVIDER_SITE_OTHER): Payer: No Typology Code available for payment source

## 2019-12-11 DIAGNOSIS — J309 Allergic rhinitis, unspecified: Secondary | ICD-10-CM

## 2019-12-12 ENCOUNTER — Telehealth: Payer: Self-pay | Admitting: Allergy

## 2019-12-12 NOTE — Telephone Encounter (Signed)
Called patient to schedule a follow up office visit due to new referral sent over from Dr. Aliene Beams. Left voicemail.

## 2019-12-21 ENCOUNTER — Ambulatory Visit (HOSPITAL_COMMUNITY): Payer: No Typology Code available for payment source | Admitting: Psychiatry

## 2019-12-22 ENCOUNTER — Ambulatory Visit (INDEPENDENT_AMBULATORY_CARE_PROVIDER_SITE_OTHER): Payer: No Typology Code available for payment source | Admitting: *Deleted

## 2019-12-22 DIAGNOSIS — J309 Allergic rhinitis, unspecified: Secondary | ICD-10-CM | POA: Diagnosis not present

## 2019-12-25 DIAGNOSIS — J301 Allergic rhinitis due to pollen: Secondary | ICD-10-CM | POA: Diagnosis not present

## 2019-12-25 NOTE — Progress Notes (Signed)
VIAL EXP 12-24-20 

## 2019-12-26 ENCOUNTER — Ambulatory Visit (INDEPENDENT_AMBULATORY_CARE_PROVIDER_SITE_OTHER): Payer: No Typology Code available for payment source | Admitting: Psychiatry

## 2019-12-26 ENCOUNTER — Other Ambulatory Visit: Payer: Self-pay

## 2019-12-26 DIAGNOSIS — F411 Generalized anxiety disorder: Secondary | ICD-10-CM

## 2019-12-26 DIAGNOSIS — F332 Major depressive disorder, recurrent severe without psychotic features: Secondary | ICD-10-CM | POA: Diagnosis not present
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
## 2020-01-09 ENCOUNTER — Telehealth: Payer: Self-pay | Admitting: *Deleted

## 2020-01-09 ENCOUNTER — Ambulatory Visit (INDEPENDENT_AMBULATORY_CARE_PROVIDER_SITE_OTHER): Payer: No Typology Code available for payment source

## 2020-01-09 DIAGNOSIS — J309 Allergic rhinitis, unspecified: Secondary | ICD-10-CM

## 2020-01-09 NOTE — Telephone Encounter (Signed)
A message was left, re: her follow up visit. 

## 2020-01-10 ENCOUNTER — Telehealth: Payer: Self-pay | Admitting: Internal Medicine

## 2020-01-10 NOTE — Telephone Encounter (Signed)
Patient is calling requesting she have labs performed a couple days before her appointment scheduled for 02/26/20 due to never having them performed at our office and her PCP suggesting it so the results can be discussed at the appt. Please advise.

## 2020-01-12 ENCOUNTER — Ambulatory Visit (INDEPENDENT_AMBULATORY_CARE_PROVIDER_SITE_OTHER): Payer: No Typology Code available for payment source | Admitting: Psychiatry

## 2020-01-12 ENCOUNTER — Other Ambulatory Visit: Payer: Self-pay

## 2020-01-12 DIAGNOSIS — F332 Major depressive disorder, recurrent severe without psychotic features: Secondary | ICD-10-CM | POA: Diagnosis not present

## 2020-01-12 NOTE — Progress Notes (Signed)
Pt reported to [facility] for cortical mapping and motor threshold determination for Repetitive Transcranial Magnetic Stimulation treatment for Major Depressive Disorder. Pt completed a PHQ-9 with a score of 24(severe depression). Pt also completed a Beck's Depression Inventory with a score of 33(severe depression). Prior to procedure, pt signed an informed consent agreement for TMS treatment. Pt's treatment area was found by applying single pulses to her left motor cortex, hunting along the anterior/posterior plane and along the superior oblique angle until the best motor response was elicited from the pt's right thumb. The best response was observed at 4.5cmA/P and 30 degrees SOA, with a coil angle of 0degrees. Pt's motor threshold was calculated using the [employer]'s proprietary MT Assist algorithm, which produced a calculated motor threshold of 1.08SMT. Per these findings, pt's treatment parameters are as follows: A/P -- 10cm, SOA -- 30degrees, Coil Angle -- 0degrees, Motor Threshold -- 1.03SMT. With these parameters, the pt will receive 36 sessions of TMS according to the following protocol: 3000 pulses per session, with stimulation in bursts of pulses lasting 4 seconds at a frequency of 10 Hz, separated by 26 seconds of rest. After determining pt's tx parameters, coil was moved to the treatment location, and the first burst of pulses was applied at a reduced power of 80%MT. Pt reported no complaints, and stated that the stimulation was tolerable. Upon completion of mapping, pt completed a few treatment intervals for observation of side effects. Pt tolerated tx well. Pt departed from clinic without issue.

## 2020-01-15 ENCOUNTER — Encounter (HOSPITAL_COMMUNITY): Payer: No Typology Code available for payment source

## 2020-01-15 NOTE — Telephone Encounter (Signed)
Left message for pt to call.

## 2020-01-16 ENCOUNTER — Other Ambulatory Visit (HOSPITAL_COMMUNITY): Payer: No Typology Code available for payment source | Attending: Psychiatry | Admitting: Emergency Medicine

## 2020-01-16 ENCOUNTER — Other Ambulatory Visit: Payer: Self-pay

## 2020-01-16 DIAGNOSIS — F332 Major depressive disorder, recurrent severe without psychotic features: Secondary | ICD-10-CM | POA: Insufficient documentation

## 2020-01-16 NOTE — Progress Notes (Signed)
Patient reported to [facility] for Repetitive Transcranial Magnetic Stimulation treatment for severe episode of recurrent major depressive disorder, without psychotic features. Patient presented with appropriate affect, level mood and denied any suicidal or homicidal ideations. Patient denies any other current symptoms and remains optimistic with continued TMS treatment. Patient reported no change in alcohol/substance use, caffeine consumption, sleep pattern or metal implant status since previous tx. Patient reported to tx session with pleasant affect.Powerwas titrated to120% for the duration of txand will remain there until patient gets adjusted. Patient reported no complaints or discomfort. Patientdeparted post-treatment with no concerns or complaints. 

## 2020-01-17 ENCOUNTER — Other Ambulatory Visit (INDEPENDENT_AMBULATORY_CARE_PROVIDER_SITE_OTHER): Payer: No Typology Code available for payment source | Admitting: Emergency Medicine

## 2020-01-17 ENCOUNTER — Other Ambulatory Visit: Payer: Self-pay

## 2020-01-17 DIAGNOSIS — F332 Major depressive disorder, recurrent severe without psychotic features: Secondary | ICD-10-CM

## 2020-01-17 NOTE — Telephone Encounter (Signed)
Returned call to patient she stated she scheduled appointment with Dr.Acharya 02/26/20.Stated she has not had any lab work and wanted to ask Dr.Acharya if she will order.Stated she would like to have cardiac enzymes checked.She is having dizziness,fast heart beat and syncope.Appointment offered sooner.Stated she has a new job and this was the only time that worked with her schedule.Stated back in the summer when she was having a lot of problems lab was never ordered due to covid.Advised I will send message to Dr.Acharya for lab order.

## 2020-01-17 NOTE — Telephone Encounter (Signed)
Would be most appropriate for Patricia Harvey to have routine lab work performed with her primary care physician as part of her routine physical exams. Any additional lab work needed in relation to cardiovascular issues can be ordered as needed at our visit. Cardiac enzymes are not checked as part of routine lab work and, if appropriate after our follow up in March, can be ordered at that time. No labs needed in anticipation of our follow up appointment.   Best, Dr. Mervyn Skeeters

## 2020-01-17 NOTE — Telephone Encounter (Signed)
° °  Pt. returning call regarding labs. Pt said if she didn't answer please leave detailed message on voice mail.

## 2020-01-17 NOTE — Progress Notes (Signed)
Patient reported to [facility] for Repetitive Transcranial Magnetic Stimulation treatment for severe episode of recurrent major depressive disorder, without psychotic features. Patient presented with appropriate affect, level mood and denied any suicidal or homicidal ideations. Patient denies any other current symptoms and remains optimistic with continued TMS treatment. Patient reported no change in alcohol/substance use, caffeine consumption, sleep pattern or metal implant status since previous tx. Patient reported to tx session with pleasant affect.Powerwas titrated to120% for the duration of txand will remain there until patient gets adjusted. Patient reported no complaints or discomfort. Patientdeparted post-treatment with no concerns or complaints. 

## 2020-01-18 ENCOUNTER — Encounter (HOSPITAL_COMMUNITY): Payer: No Typology Code available for payment source

## 2020-01-18 NOTE — Telephone Encounter (Signed)
Spoke to patient.  RN gave patient  The response from Dr Jacques Navy.  RN  Had long conversation with patient lasting @ 25 min.  Patient wanting to cancel appointment if nothing was going to be done.  Patient states she is getting different message from  Primary and Dr Jacques Navy on what maybe needed. Patient wanted to know what actually  will be done at the office visit. RN explained that  The visit will be gathering of data  To see how she is doing since last visit to see if any  Further testing is needed. Explained that at the present time  - the cardiac enzymes may not needed if she is not having any symptoms, but once Dr Jacques Navy talks with patient. The decision will be made on any other labs.  patient mentioned about tilt test.  RN informed patient she will need discuss with Dr Jacques Navy. RN advised to keep hydrated, may use salt enrich food as  discussed with physician assistant at past office visit.  watch urine output ( especially the concentration). Patient voiced understanding and states she will keep appointment. She will als0 keep a record of symptoms for upcoming appointment. She needed an appointment late in the day . Patient also request a copy of this call be sent to primary. RN routed to [employer].

## 2020-01-19 ENCOUNTER — Encounter: Payer: Self-pay | Admitting: Neurology

## 2020-01-19 ENCOUNTER — Other Ambulatory Visit: Payer: Self-pay

## 2020-01-19 ENCOUNTER — Other Ambulatory Visit (INDEPENDENT_AMBULATORY_CARE_PROVIDER_SITE_OTHER): Payer: No Typology Code available for payment source | Admitting: Emergency Medicine

## 2020-01-19 DIAGNOSIS — F332 Major depressive disorder, recurrent severe without psychotic features: Secondary | ICD-10-CM | POA: Diagnosis not present

## 2020-01-19 NOTE — Progress Notes (Signed)
Patient reported to [facility] for Repetitive Transcranial Magnetic Stimulation treatment for severe episode of recurrent major depressive disorder, without psychotic features. Patient presented with appropriate affect, level mood and denied any suicidal or homicidal ideations. Patient denies any other current symptoms and remains optimistic with continued TMS treatment. Patient reported no change in alcohol/substance use, caffeine consumption, sleep pattern or metal implant status since previous tx. Patient reported to tx session with pleasant affect.Powerwas titrated to120% for the duration of txand will remain there until patient gets adjusted. Patient reported no complaints or discomfort. Patientdeparted post-treatment with no concerns or complaints. 

## 2020-01-22 ENCOUNTER — Other Ambulatory Visit: Payer: Self-pay

## 2020-01-22 ENCOUNTER — Other Ambulatory Visit (INDEPENDENT_AMBULATORY_CARE_PROVIDER_SITE_OTHER): Payer: No Typology Code available for payment source | Admitting: Emergency Medicine

## 2020-01-22 DIAGNOSIS — F332 Major depressive disorder, recurrent severe without psychotic features: Secondary | ICD-10-CM

## 2020-01-22 MED FILL — busPIRone HCL 10 MG TABS: 10 | 30 days supply | Qty: 90 | Fill #0

## 2020-01-22 MED FILL — ARMODAFINIL 50 MG TABLET: 50 | 30 days supply | Qty: 60 | Fill #0

## 2020-01-22 NOTE — Progress Notes (Signed)
Patient reported to [facility] for Repetitive Transcranial Magnetic Stimulation treatment for severe episode of recurrent major depressive disorder, without psychotic features. Patient presented with appropriate affect, level mood and denied any suicidal or homicidal ideations. Patient denies any other current symptoms and remains optimistic with continued TMS treatment. Patient reported no change in alcohol/substance use, caffeine consumption, sleep pattern or metal implant status since previous tx. Patient reported to tx session with pleasant affect.Powerwas titrated to120% for the duration of txand will remain there until patient gets adjusted. Patient reported no complaints or discomfort. Patientdeparted post-treatment with no concerns or complaints. 

## 2020-01-23 ENCOUNTER — Other Ambulatory Visit (HOSPITAL_COMMUNITY): Payer: No Typology Code available for payment source | Admitting: Emergency Medicine

## 2020-01-23 ENCOUNTER — Other Ambulatory Visit: Payer: Self-pay

## 2020-01-23 ENCOUNTER — Encounter (HOSPITAL_COMMUNITY): Payer: No Typology Code available for payment source

## 2020-01-23 DIAGNOSIS — F332 Major depressive disorder, recurrent severe without psychotic features: Secondary | ICD-10-CM

## 2020-01-23 NOTE — Progress Notes (Signed)
Patient reported to [facility] for Repetitive Transcranial Magnetic Stimulation treatment for severe episode of recurrent major depressive disorder, without psychotic features. Patient presented with appropriate affect, level mood and denied any suicidal or homicidal ideations. Patient denies any other current symptoms and remains optimistic with continued TMS treatment. Patient reported no change in alcohol/substance use, caffeine consumption, sleep pattern or metal implant status since previous tx. Patient reported to tx session with pleasant affect.Powerwas titrated to120% for the duration of txand will remain there until patient gets adjusted. Patient reported no complaints or discomfort. Patientdeparted post-treatment with no concerns or complaints. 

## 2020-01-24 ENCOUNTER — Other Ambulatory Visit (INDEPENDENT_AMBULATORY_CARE_PROVIDER_SITE_OTHER): Payer: No Typology Code available for payment source | Admitting: Emergency Medicine

## 2020-01-24 ENCOUNTER — Other Ambulatory Visit: Payer: Self-pay

## 2020-01-24 DIAGNOSIS — F332 Major depressive disorder, recurrent severe without psychotic features: Secondary | ICD-10-CM

## 2020-01-24 NOTE — Progress Notes (Signed)
Patient reported to [facility] for Repetitive Transcranial Magnetic Stimulation treatment for severe episode of recurrent major depressive disorder, without psychotic features. Patient presented with appropriate affect, level mood and denied any suicidal or homicidal ideations. Patient denies any other current symptoms and remains optimistic with continued TMS treatment. Patient reported no change in alcohol/substance use, caffeine consumption, sleep pattern or metal implant status since previous tx. Patient reported to tx session with pleasant affect.Powerwas titrated to120% for the duration of txand will remain there until patient gets adjusted. Patient reported no complaints or discomfort. Patientdeparted post-treatment with no concerns or complaints. 

## 2020-01-25 ENCOUNTER — Encounter (HOSPITAL_COMMUNITY): Payer: No Typology Code available for payment source

## 2020-01-25 ENCOUNTER — Ambulatory Visit (INDEPENDENT_AMBULATORY_CARE_PROVIDER_SITE_OTHER): Payer: No Typology Code available for payment source

## 2020-01-25 DIAGNOSIS — J309 Allergic rhinitis, unspecified: Secondary | ICD-10-CM

## 2020-01-26 ENCOUNTER — Other Ambulatory Visit: Payer: Self-pay

## 2020-01-26 ENCOUNTER — Other Ambulatory Visit (INDEPENDENT_AMBULATORY_CARE_PROVIDER_SITE_OTHER): Payer: No Typology Code available for payment source | Admitting: Emergency Medicine

## 2020-01-26 DIAGNOSIS — F332 Major depressive disorder, recurrent severe without psychotic features: Secondary | ICD-10-CM

## 2020-01-26 NOTE — Progress Notes (Signed)
Patient reported to [facility] for Repetitive Transcranial Magnetic Stimulation treatment for severe episode of recurrent major depressive disorder, without psychotic features. Patient presented with appropriate affect, level mood and denied any suicidal or homicidal ideations. Patient denies any other current symptoms and remains optimistic with continued TMS treatment. Patient reported no change in alcohol/substance use, caffeine consumption, sleep pattern or metal implant status since previous tx. Patient reported to tx session with pleasant affect.Powerwas titrated to120% for the duration of txand will remain there until patient gets adjusted. Patient reported no complaints or discomfort. Patientdeparted post-treatment with no concerns or complaints. 

## 2020-01-29 ENCOUNTER — Other Ambulatory Visit: Payer: Self-pay

## 2020-01-29 ENCOUNTER — Other Ambulatory Visit (HOSPITAL_COMMUNITY): Payer: No Typology Code available for payment source | Attending: Psychiatry | Admitting: Emergency Medicine

## 2020-01-29 DIAGNOSIS — F332 Major depressive disorder, recurrent severe without psychotic features: Secondary | ICD-10-CM

## 2020-01-29 NOTE — Progress Notes (Signed)
Patient reported to [facility] for Repetitive Transcranial Magnetic Stimulation treatment for severe episode of recurrent major depressive disorder, without psychotic features. Patient presented with appropriate affect, level mood and denied any suicidal or homicidal ideations. Patient denies any other current symptoms and remains optimistic with continued TMS treatment. Patient reported no change in alcohol/substance use, caffeine consumption, sleep pattern or metal implant status since previous tx. Patient reported to tx session with pleasant affect.Powerwas titrated to120% for the duration of txand will remain there until patient gets adjusted. Patient reported no complaints or discomfort. Patientdeparted post-treatment with no concerns or complaints. 

## 2020-01-30 ENCOUNTER — Other Ambulatory Visit: Payer: Self-pay

## 2020-01-30 ENCOUNTER — Other Ambulatory Visit (HOSPITAL_COMMUNITY): Payer: No Typology Code available for payment source | Admitting: Emergency Medicine

## 2020-01-31 ENCOUNTER — Other Ambulatory Visit (INDEPENDENT_AMBULATORY_CARE_PROVIDER_SITE_OTHER): Payer: No Typology Code available for payment source | Admitting: Emergency Medicine

## 2020-01-31 ENCOUNTER — Other Ambulatory Visit: Payer: Self-pay

## 2020-01-31 DIAGNOSIS — F332 Major depressive disorder, recurrent severe without psychotic features: Secondary | ICD-10-CM | POA: Diagnosis not present

## 2020-01-31 NOTE — Progress Notes (Signed)
Patient reported to [facility] for Repetitive Transcranial Magnetic Stimulation treatment for severe episode of recurrent major depressive disorder, without psychotic features. Patient presented with appropriate affect, level mood and denied any suicidal or homicidal ideations. Patient denies any other current symptoms and remains optimistic with continued TMS treatment. Patient reported no change in alcohol/substance use, caffeine consumption, sleep pattern or metal implant status since previous tx. Patient reported to tx session with pleasant affect.Powerwas titrated to120% for the duration of txand will remain there until patient gets adjusted. Patient reported no complaints or discomfort. Patientdeparted post-treatment with no concerns or complaints. 

## 2020-02-01 MED FILL — TAMSULOSIN HCL 0.4 MG CAP: 0.4 | 30 days supply | Qty: 30 | Fill #0

## 2020-02-02 ENCOUNTER — Other Ambulatory Visit: Payer: Self-pay

## 2020-02-02 ENCOUNTER — Other Ambulatory Visit (INDEPENDENT_AMBULATORY_CARE_PROVIDER_SITE_OTHER): Payer: No Typology Code available for payment source | Admitting: Emergency Medicine

## 2020-02-02 DIAGNOSIS — F332 Major depressive disorder, recurrent severe without psychotic features: Secondary | ICD-10-CM

## 2020-02-02 NOTE — Progress Notes (Signed)
Patient reported to [facility] for Repetitive Transcranial Magnetic Stimulation treatment for severe episode of recurrent major depressive disorder, without psychotic features. Patient presented with appropriate affect, level mood and denied any suicidal or homicidal ideations. Patient denies any other current symptoms and remains optimistic with continued TMS treatment. Patient reported no change in alcohol/substance use, caffeine consumption, sleep pattern or metal implant status since previous tx. Patient reported to tx session with pleasant affect.Powerwas titrated to120% for the duration of txand will remain there until patient gets adjusted. Patient reported no complaints or discomfort. Patientdeparted post-treatment with no concerns or complaints. 

## 2020-02-05 ENCOUNTER — Other Ambulatory Visit: Payer: Self-pay

## 2020-02-05 ENCOUNTER — Other Ambulatory Visit (HOSPITAL_COMMUNITY): Payer: No Typology Code available for payment source | Admitting: Emergency Medicine

## 2020-02-05 DIAGNOSIS — F332 Major depressive disorder, recurrent severe without psychotic features: Secondary | ICD-10-CM

## 2020-02-05 NOTE — Progress Notes (Signed)
Patient reported to [facility] for Repetitive Transcranial Magnetic Stimulation treatment for severe episode of recurrent major depressive disorder, without psychotic features. Patient presented with appropriate affect, level mood and denied any suicidal or homicidal ideations. Patient denies any other current symptoms and remains optimistic with continued TMS treatment. Patient reported no change in alcohol/substance use, caffeine consumption, sleep pattern or metal implant status since previous tx. Patient reported to tx session with pleasant affect.Powerwas titrated to120% for the duration of txand will remain there until patient gets adjusted. Patient reported no complaints or discomfort. Patientdeparted post-treatment with no concerns or complaints. 

## 2020-02-06 ENCOUNTER — Other Ambulatory Visit: Payer: Self-pay

## 2020-02-06 ENCOUNTER — Other Ambulatory Visit (INDEPENDENT_AMBULATORY_CARE_PROVIDER_SITE_OTHER): Payer: No Typology Code available for payment source | Admitting: Emergency Medicine

## 2020-02-06 DIAGNOSIS — F332 Major depressive disorder, recurrent severe without psychotic features: Secondary | ICD-10-CM

## 2020-02-06 NOTE — Progress Notes (Signed)
Patient reported to [facility] for Repetitive Transcranial Magnetic Stimulation treatment for severe episode of recurrent major depressive disorder, without psychotic features. Patient presented with appropriate affect, level mood and denied any suicidal or homicidal ideations. Patient denies any other current symptoms and remains optimistic with continued TMS treatment. Patient reported no change in alcohol/substance use, caffeine consumption, sleep pattern or metal implant status since previous tx. Patient reported to tx session with pleasant affect.Powerwas titrated to120% for the duration of txand will remain there until patient gets adjusted. Patient reported no complaints or discomfort. Patientdeparted post-treatment with no concerns or complaints. 

## 2020-02-07 ENCOUNTER — Other Ambulatory Visit: Payer: Self-pay

## 2020-02-07 ENCOUNTER — Other Ambulatory Visit (INDEPENDENT_AMBULATORY_CARE_PROVIDER_SITE_OTHER): Payer: No Typology Code available for payment source | Admitting: Emergency Medicine

## 2020-02-07 DIAGNOSIS — F332 Major depressive disorder, recurrent severe without psychotic features: Secondary | ICD-10-CM | POA: Diagnosis not present

## 2020-02-07 NOTE — Progress Notes (Signed)
Patient reported to [facility] for Repetitive Transcranial Magnetic Stimulation treatment for severe episode of recurrent major depressive disorder, without psychotic features. Patient presented with appropriate affect, level mood and denied any suicidal or homicidal ideations. Patient denies any other current symptoms and remains optimistic with continued TMS treatment. Patient reported no change in alcohol/substance use, caffeine consumption, sleep pattern or metal implant status since previous tx. Patient reported to tx session with pleasant affect. Pt scored a 20 on her PHQ-9.Powerwas titrated to120% for the duration of txand will remain there until patient gets adjusted. Patient reported no complaints or discomfort. Patientdeparted post-treatment with no concerns or complaints

## 2020-02-08 ENCOUNTER — Other Ambulatory Visit: Payer: Self-pay

## 2020-02-08 ENCOUNTER — Ambulatory Visit (INDEPENDENT_AMBULATORY_CARE_PROVIDER_SITE_OTHER): Payer: No Typology Code available for payment source

## 2020-02-08 ENCOUNTER — Other Ambulatory Visit (INDEPENDENT_AMBULATORY_CARE_PROVIDER_SITE_OTHER): Payer: No Typology Code available for payment source | Admitting: Emergency Medicine

## 2020-02-08 DIAGNOSIS — F332 Major depressive disorder, recurrent severe without psychotic features: Secondary | ICD-10-CM

## 2020-02-08 DIAGNOSIS — J309 Allergic rhinitis, unspecified: Secondary | ICD-10-CM

## 2020-02-08 NOTE — Progress Notes (Signed)
Patient reported to [facility] for Repetitive Transcranial Magnetic Stimulation treatment for severe episode of recurrent major depressive disorder, without psychotic features. Patient presented with appropriate affect, level mood and denied any suicidal or homicidal ideations. Patient denies any other current symptoms and remains optimistic with continued TMS treatment. Patient reported no change in alcohol/substance use, caffeine consumption, sleep pattern or metal implant status since previous tx. Patient reported to tx session with pleasant affect.Powerwas titrated to120% for the duration of txand will remain there until patient gets adjusted. Patient reported no complaints or discomfort. Patientdeparted post-treatment with no concerns or complaints. 

## 2020-02-09 ENCOUNTER — Other Ambulatory Visit: Payer: Self-pay

## 2020-02-09 ENCOUNTER — Other Ambulatory Visit (INDEPENDENT_AMBULATORY_CARE_PROVIDER_SITE_OTHER): Payer: No Typology Code available for payment source | Admitting: Emergency Medicine

## 2020-02-09 DIAGNOSIS — F332 Major depressive disorder, recurrent severe without psychotic features: Secondary | ICD-10-CM

## 2020-02-09 NOTE — Progress Notes (Signed)
Patient reported to [facility] for Repetitive Transcranial Magnetic Stimulation treatment for severe episode of recurrent major depressive disorder, without psychotic features. Patient presented with appropriate affect, level mood and denied any suicidal or homicidal ideations. Patient denies any other current symptoms and remains optimistic with continued TMS treatment. Patient reported no change in alcohol/substance use, caffeine consumption, sleep pattern or metal implant status since previous tx. Patient reported to tx session with pleasant affect.Powerwas titrated to120% for the duration of txand will remain there until patient gets adjusted. Patient reported no complaints or discomfort. Patientdeparted post-treatment with no concerns or complaints. 

## 2020-02-12 ENCOUNTER — Other Ambulatory Visit (HOSPITAL_COMMUNITY): Payer: No Typology Code available for payment source | Admitting: Emergency Medicine

## 2020-02-12 ENCOUNTER — Other Ambulatory Visit: Payer: Self-pay

## 2020-02-12 DIAGNOSIS — F332 Major depressive disorder, recurrent severe without psychotic features: Secondary | ICD-10-CM

## 2020-02-12 NOTE — Progress Notes (Signed)
Patient reported to [facility] for Repetitive Transcranial Magnetic Stimulation treatment for severe episode of recurrent major depressive disorder, without psychotic features. Patient presented with appropriate affect, level mood and denied any suicidal or homicidal ideations. Patient denies any other current symptoms and remains optimistic with continued TMS treatment. Patient reported no change in alcohol/substance use, caffeine consumption, sleep pattern or metal implant status since previous tx. Patient reported to tx session with pleasant affect.Powerwas titrated to120% for the duration of txand will remain there until patient gets adjusted. Patient reported no complaints or discomfort. Patientdeparted post-treatment with no concerns or complaints. 

## 2020-02-13 ENCOUNTER — Other Ambulatory Visit: Payer: Self-pay

## 2020-02-13 ENCOUNTER — Other Ambulatory Visit (INDEPENDENT_AMBULATORY_CARE_PROVIDER_SITE_OTHER): Payer: No Typology Code available for payment source | Admitting: Emergency Medicine

## 2020-02-13 DIAGNOSIS — F332 Major depressive disorder, recurrent severe without psychotic features: Secondary | ICD-10-CM | POA: Diagnosis not present

## 2020-02-13 NOTE — Progress Notes (Signed)
Patient reported to [facility] for Repetitive Transcranial Magnetic Stimulation treatment for severe episode of recurrent major depressive disorder, without psychotic features. Patient presented with appropriate affect, level mood and denied any suicidal or homicidal ideations. Patient denies any other current symptoms and remains optimistic with continued TMS treatment. Patient reported no change in alcohol/substance use, caffeine consumption, sleep pattern or metal implant status since previous tx. Patient reported to tx session with pleasant affect.Powerwas titrated to120% for the duration of txand will remain there until patient gets adjusted. Patient reported no complaints or discomfort. Patientdeparted post-treatment with no concerns or complaints. 

## 2020-02-14 ENCOUNTER — Other Ambulatory Visit (HOSPITAL_COMMUNITY): Payer: No Typology Code available for payment source | Admitting: Emergency Medicine

## 2020-02-14 ENCOUNTER — Other Ambulatory Visit: Payer: Self-pay

## 2020-02-14 DIAGNOSIS — F332 Major depressive disorder, recurrent severe without psychotic features: Secondary | ICD-10-CM

## 2020-02-14 NOTE — Progress Notes (Signed)
Patient reported to [facility] for Repetitive Transcranial Magnetic Stimulation treatment for severe episode of recurrent major depressive disorder, without psychotic features. Patient presented with appropriate affect, level mood and denied any suicidal or homicidal ideations. Patient denies any other current symptoms and remains optimistic with continued TMS treatment. Patient reported no change in alcohol/substance use, caffeine consumption, sleep pattern or metal implant status since previous tx. Patient reported to tx session with pleasant affect.Powerwas titrated to120% for the duration of txand will remain there until patient gets adjusted. Patient reported no complaints or discomfort. Patientdeparted post-treatment with no concerns or complaints. 

## 2020-02-15 ENCOUNTER — Other Ambulatory Visit (INDEPENDENT_AMBULATORY_CARE_PROVIDER_SITE_OTHER): Payer: No Typology Code available for payment source | Admitting: Emergency Medicine

## 2020-02-15 ENCOUNTER — Other Ambulatory Visit: Payer: Self-pay

## 2020-02-15 DIAGNOSIS — F332 Major depressive disorder, recurrent severe without psychotic features: Secondary | ICD-10-CM

## 2020-02-15 MED FILL — busPIRone HCL 10 MG TABS: 10 | 30 days supply | Qty: 90 | Fill #0

## 2020-02-15 NOTE — Progress Notes (Signed)
Patient reported to [facility] for Repetitive Transcranial Magnetic Stimulation treatment for severe episode of recurrent major depressive disorder, without psychotic features. Patient presented with appropriate affect, level mood and denied any suicidal or homicidal ideations. Patient denies any other current symptoms and remains optimistic with continued TMS treatment. Patient reported no change in alcohol/substance use, caffeine consumption, sleep pattern or metal implant status since previous tx. Patient reported to tx session with pleasant affect. Pt was accompanied by her husband. They are going to the gym and out to dinner to celebrate her birthday. Pt expressed that she felt small pain in her jaws.Powerwas titrated to120% for the duration of txand will remain there until patient gets adjusted. Patient reported no complaints or discomfort. Patientdeparted post-treatment with no concerns or complaints

## 2020-02-16 ENCOUNTER — Other Ambulatory Visit (HOSPITAL_COMMUNITY): Payer: No Typology Code available for payment source | Admitting: Emergency Medicine

## 2020-02-16 ENCOUNTER — Other Ambulatory Visit: Payer: Self-pay

## 2020-02-16 DIAGNOSIS — F332 Major depressive disorder, recurrent severe without psychotic features: Secondary | ICD-10-CM

## 2020-02-16 MED FILL — ARMODAFINIL 50 MG TABLET: 50 | 30 days supply | Qty: 60 | Fill #0

## 2020-02-16 NOTE — Progress Notes (Signed)
Patient reported to [facility] for Repetitive Transcranial Magnetic Stimulation treatment for severe episode of recurrent major depressive disorder, without psychotic features. Patient presented with appropriate affect, level mood and denied any suicidal or homicidal ideations. Patient denies any other current symptoms and remains optimistic with continued TMS treatment. Patient reported no change in alcohol/substance use, caffeine consumption, sleep pattern or metal implant status since previous tx. Patient reported to tx session with pleasant affect.Powerwas titrated to120% for the duration of txand will remain there until patient gets adjusted. Patient reported no complaints or discomfort. Patientdeparted post-treatment with no concerns or complaints. 

## 2020-02-19 ENCOUNTER — Other Ambulatory Visit: Payer: Self-pay

## 2020-02-19 ENCOUNTER — Other Ambulatory Visit (HOSPITAL_COMMUNITY): Payer: No Typology Code available for payment source | Admitting: Emergency Medicine

## 2020-02-19 DIAGNOSIS — F332 Major depressive disorder, recurrent severe without psychotic features: Secondary | ICD-10-CM

## 2020-02-19 NOTE — Progress Notes (Signed)
Patient reported to [facility] for Repetitive Transcranial Magnetic Stimulation treatment for severe episode of recurrent major depressive disorder, without psychotic features. Patient presented with appropriate affect, level mood and denied any suicidal or homicidal ideations. Patient denies any other current symptoms and remains optimistic with continued TMS treatment. Patient reported no change in alcohol/substance use, caffeine consumption, sleep pattern or metal implant status since previous tx. Patient reported to tx session with pleasant affect.Powerwas titrated to120% for the duration of txand will remain there until patient gets adjusted. Patient reported no complaints or discomfort. Patientdeparted post-treatment with no concerns or complaints. 

## 2020-02-20 ENCOUNTER — Other Ambulatory Visit (HOSPITAL_COMMUNITY): Payer: No Typology Code available for payment source | Admitting: Emergency Medicine

## 2020-02-20 ENCOUNTER — Other Ambulatory Visit: Payer: Self-pay

## 2020-02-20 DIAGNOSIS — F332 Major depressive disorder, recurrent severe without psychotic features: Secondary | ICD-10-CM

## 2020-02-20 NOTE — Progress Notes (Signed)
Patient reported to [facility] for Repetitive Transcranial Magnetic Stimulation treatment for severe episode of recurrent major depressive disorder, without psychotic features. Patient presented with appropriate affect, level mood and denied any suicidal or homicidal ideations. Patient denies any other current symptoms and remains optimistic with continued TMS treatment. Patient reported no change in alcohol/substance use, caffeine consumption, sleep pattern or metal implant status since previous tx. Patient reported to tx session with pleasant affect.Powerwas titrated to120% for the duration of txand will remain there until patient gets adjusted. Patient reported no complaints or discomfort. Patientdeparted post-treatment with no concerns or complaints. 

## 2020-02-21 ENCOUNTER — Encounter (HOSPITAL_COMMUNITY): Payer: No Typology Code available for payment source

## 2020-02-21 ENCOUNTER — Ambulatory Visit (INDEPENDENT_AMBULATORY_CARE_PROVIDER_SITE_OTHER): Payer: No Typology Code available for payment source

## 2020-02-21 DIAGNOSIS — J309 Allergic rhinitis, unspecified: Secondary | ICD-10-CM | POA: Diagnosis not present

## 2020-02-22 ENCOUNTER — Other Ambulatory Visit (INDEPENDENT_AMBULATORY_CARE_PROVIDER_SITE_OTHER): Payer: No Typology Code available for payment source | Admitting: Emergency Medicine

## 2020-02-22 ENCOUNTER — Other Ambulatory Visit: Payer: Self-pay

## 2020-02-22 DIAGNOSIS — F332 Major depressive disorder, recurrent severe without psychotic features: Secondary | ICD-10-CM | POA: Diagnosis not present

## 2020-02-22 NOTE — Progress Notes (Signed)
Patient reported to [facility] for Repetitive Transcranial Magnetic Stimulation treatment for severe episode of recurrent major depressive disorder, without psychotic features. Patient presented with appropriate affect, level mood and denied any suicidal or homicidal ideations. Patient denies any other current symptoms and remains optimistic with continued TMS treatment. Patient reported no change in alcohol/substance use, caffeine consumption, sleep pattern or metal implant status since previous tx. Pt expressed that she feels irritated and aggravated. She thinks that it might be heightened due to her period. She plans on speaking to her psychiatrist to increase her dosage of generic Lamictal. Patient reported to tx session with pleasant affect.Powerwas titrated to120% for the duration of txand will remain there until patient gets adjusted. Patient reported no complaints or discomfort. Patientdeparted post-treatment with no concerns or complaints

## 2020-02-23 ENCOUNTER — Telehealth (HOSPITAL_COMMUNITY): Payer: Self-pay

## 2020-02-23 ENCOUNTER — Other Ambulatory Visit (HOSPITAL_COMMUNITY): Payer: No Typology Code available for payment source | Admitting: Emergency Medicine

## 2020-02-23 ENCOUNTER — Other Ambulatory Visit: Payer: Self-pay

## 2020-02-23 ENCOUNTER — Encounter: Payer: Self-pay | Admitting: Psychiatry

## 2020-02-23 DIAGNOSIS — F332 Major depressive disorder, recurrent severe without psychotic features: Secondary | ICD-10-CM

## 2020-02-23 NOTE — Progress Notes (Signed)
Patient reported to [facility] for Repetitive Transcranial Magnetic Stimulation treatment for severe episode of recurrent major depressive disorder, without psychotic features. Patient presented with appropriate affect, level mood and denied any suicidal or homicidal ideations. Patient denies any other current symptoms and remains optimistic with continued TMS treatment. Patient reported no change in alcohol/substance use, caffeine consumption, sleep pattern or metal implant status since previous tx. Pt expressed that she feels better after doing TMS yesterday. Powerwas titrated to120% for the duration of txand will remain there until patient gets adjusted. Patient reported no complaints or discomfort. Patientdeparted post-treatment with no concerns or complaints

## 2020-02-23 NOTE — Telephone Encounter (Signed)
Medication management - Telephone call with patient to follow up on her concerns she has felt she had not always been listened to when getting TMS treatment.  States no symptoms today and disucssed that Dr. Jama Flavors had called pt back earlier today, covering for Dr. Lucianne Muss.  Patient agreed to call this nurse manager back if any other problems with treatment and agreed would send patient a patient survey for feedback.  Patient with no other concerns at this time and will follow up on previous concerns.

## 2020-02-23 NOTE — Progress Notes (Signed)
Patient ID: Patricia Harvey, female   DOB: [birth year], 29 y.o.   MRN: [id number] Telephone communication-02/23/2020 at 12:40 PM  Received e mail based communication earlier today  from Patricia Harvey, Patient Relations Specialist , that Patricia Harvey is an outpatient under the care of Patricia Harvey , and  was requesting to speak with her . Patricia Harvey is currently out of town/unavailable.  I spoke with Patricia Harvey via phone.  She reports she has a history of depression, anxiety, for which she is currently undergoing TMS under the care of Patricia Harvey . She also has an established outpatient psychiatrist, Patricia Harvey at [facility].  She reports that over the last week she has felt more labile emotionally, slightly restless, and has not been able to sleep well . She states reports her current medications are Buspar, Lamotrigine , and NAC as supplement . She states symptoms may have coincided to onset of  menses onset a few days ago. Currently she denies suicidal Harvey self injurious ideations,  denies homicidal Harvey violent ideations, denies any psychotic symptoms . She is functioning  in her daily activities.  She reports she has also left message for her outpatient psychiatrist , Patricia Harvey at [facility], to give her a call back.  We reviewed current symptoms. I recommended she speak with Patricia Harvey , her outpatient psychiatrist, to determine if any medication management adjustment  may be indicated , and that I will relay her questions to Patricia Harvey, who will be back early next week. If patient feels that TMS is contributing to Harvey causing mood instability, it may be prudent to hold off on further treatments until she can speak with her psychiatrist and Patricia Harvey to determine whether to continue . Patient aware that she can /should  come to ED Harvey here at [facility] (as walk in) at any time should there be worsening of symptoms Harvey concerns about her safety.  I will speak with Patricia Harvey regarding Patricia Harvey's call/concerns  .  Sallyanne Havers , MD

## 2020-02-26 ENCOUNTER — Other Ambulatory Visit (INDEPENDENT_AMBULATORY_CARE_PROVIDER_SITE_OTHER): Payer: No Typology Code available for payment source | Admitting: Emergency Medicine

## 2020-02-26 ENCOUNTER — Telehealth: Payer: Self-pay | Admitting: Neurology

## 2020-02-26 ENCOUNTER — Other Ambulatory Visit: Payer: Self-pay

## 2020-02-26 ENCOUNTER — Encounter: Payer: Self-pay | Admitting: Internal Medicine

## 2020-02-26 ENCOUNTER — Ambulatory Visit (INDEPENDENT_AMBULATORY_CARE_PROVIDER_SITE_OTHER): Payer: No Typology Code available for payment source | Admitting: Internal Medicine

## 2020-02-26 ENCOUNTER — Ambulatory Visit (INDEPENDENT_AMBULATORY_CARE_PROVIDER_SITE_OTHER): Payer: No Typology Code available for payment source | Admitting: Neurology

## 2020-02-26 ENCOUNTER — Encounter: Payer: Self-pay | Admitting: Neurology

## 2020-02-26 VITALS — BP 110/64 | HR 90 | Temp 97.7°F | Ht 63.0 in | Wt 126.0 lb

## 2020-02-26 VITALS — BP 118/80 | HR 96 | Ht 63.0 in | Wt 128.0 lb

## 2020-02-26 DIAGNOSIS — K219 Gastro-esophageal reflux disease without esophagitis: Secondary | ICD-10-CM

## 2020-02-26 DIAGNOSIS — R42 Dizziness and giddiness: Secondary | ICD-10-CM

## 2020-02-26 DIAGNOSIS — F332 Major depressive disorder, recurrent severe without psychotic features: Secondary | ICD-10-CM

## 2020-02-26 DIAGNOSIS — R55 Syncope and collapse: Secondary | ICD-10-CM

## 2020-02-26 DIAGNOSIS — R002 Palpitations: Secondary | ICD-10-CM | POA: Diagnosis not present

## 2020-02-26 DIAGNOSIS — R4689 Other symptoms and signs involving appearance and behavior: Secondary | ICD-10-CM

## 2020-02-26 DIAGNOSIS — F0781 Postconcussional syndrome: Secondary | ICD-10-CM | POA: Diagnosis not present

## 2020-02-26 DIAGNOSIS — F419 Anxiety disorder, unspecified: Secondary | ICD-10-CM

## 2020-02-26 DIAGNOSIS — K200 Eosinophilic esophagitis: Secondary | ICD-10-CM

## 2020-02-26 DIAGNOSIS — F4310 Post-traumatic stress disorder, unspecified: Secondary | ICD-10-CM

## 2020-02-26 DIAGNOSIS — R4189 Other symptoms and signs involving cognitive functions and awareness: Secondary | ICD-10-CM

## 2020-02-26 NOTE — Progress Notes (Signed)
Patient reported to [facility] for Repetitive Transcranial Magnetic Stimulation treatment for severe episode of recurrent major depressive disorder, without psychotic features. Patient presented with appropriate affect, level mood and denied any suicidal or homicidal ideations. Patient denies any other current symptoms and remains optimistic with continued TMS treatment. Patient reported no change in alcohol/substance use, caffeine consumption, sleep pattern or metal implant status since previous tx. Patient reported to tx session with pleasant affect.Powerwas titrated to120% for the duration of txand will remain there until patient gets adjusted. Patient reported no complaints or discomfort. Patientdeparted post-treatment with no concerns or complaints. 
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
## 2020-02-26 NOTE — Telephone Encounter (Signed)
Patient states that the MRI EEG and Neuropsy testing has to have a prior auth done before appt   EEG is sch for 03-19-20   She held off with the Neuropsy testing at this time but wanted you to know that it would require prior auth

## 2020-02-26 NOTE — Telephone Encounter (Signed)
Working on prior authorization.

## 2020-02-26 NOTE — Patient Instructions (Signed)
Medication Instructions:  No changes *If you need a refill on your cardiac medications before your next appointment, please call your pharmacy*   Lab Work: Not needed   Testing/Procedures: not needed   Follow-Up: At [facility], you and your health needs are our priority.  As part of our continuing mission to provide you with exceptional heart care, we have created designated Provider Care Teams.  These Care Teams include your primary Cardiologist (physician) and Advanced Practice Providers (APPs -  Physician Assistants and Nurse Practitioners) who all work together to provide you with the care you need, when you need it.  We recommend signing up for the patient portal called "MyChart".  Sign up information is provided on this After Visit Summary.  MyChart is used to connect with patients for Virtual Visits (Telemedicine).  Patients are able to view lab/test results, encounter notes, upcoming appointments, etc.  Non-urgent messages can be sent to your provider as well.   To learn more about what you can do with MyChart, go to [email or web address].    Your next appointment:    as needed  The format for your next appointment:   Either In Person or Virtual  Provider:   Weston Brass, MD

## 2020-02-26 NOTE — Progress Notes (Signed)
Cardiology Office Note:    Date:  02/26/2020   ID:  Ashleynicole Mcclees, DOB 1991/04/27, MRN [id number]  PCP:  Aliene Beams, MD  Cardiologist:  Parke Poisson, MD  Electrophysiologist:  None   Referring MD: Aliene Beams, MD   Chief Complaint: f/u dizziness  History of Present Illness:    Patricia Harvey is a 29 y.o. female with a history of esoinophilic eosphagitis, anxiety, posttraumatic stress disorder, idiopathic hypersomnia, vertigo, fatigue. She presents for follow up of dizziness.  She feels her dizziness is now more related to repeated head trauma. She has "hit her head" several times in the past and is concerned about a neurologic etiology for her symptoms. She has tried conservative measures for autonomic dysfunction and has had modest success with these.   Denies chest pain or SOB.                                                                                                             Past Medical History:  Diagnosis Date  . Environmental allergies   . Eosinophilic esophagitis   . IBS (irritable bowel syndrome)     Past Surgical History:  Procedure Laterality Date  . COLONOSCOPY    . TONSILLECTOMY  2004  . UPPER GI ENDOSCOPY    . WISDOM TOOTH EXTRACTION      Current Medications: Current Meds  Medication Sig  . albuterol (VENTOLIN HFA) 108 (90 Base) MCG/ACT inhaler Inhale 2 puffs into the lungs every 4 (four) hours as needed for wheezing or shortness of breath.  . Armodafinil 150 MG tablet   . Armodafinil 50 MG tablet Take 100 mg by mouth daily.  Marland Kitchen azelastine (ASTELIN) 0.1 % nasal spray Place into the nose.  . Bromelains 500 MG TABS Take by mouth.  . busPIRone (BUSPAR) 5 MG tablet Take 5 mg by mouth daily.   . cyclobenzaprine (FLEXERIL) 10 MG tablet Take 10 mg by mouth as needed.   . fluticasone (FLONASE) 50 MCG/ACT nasal spray 2 sprays by Each Nare route daily.  Marland Kitchen lamoTRIgine (LAMICTAL) 25 MG tablet Take 100 mg by mouth every morning.  Marland Kitchen levocetirizine (XYZAL) 5  MG tablet Take 5 mg by mouth every evening.  . meloxicam (MOBIC) 15 MG tablet Take 15 mg by mouth as needed.   . propranolol (INDERAL) 10 MG tablet Take 10 mg by mouth as needed.   . TURMERIC PO Take 1 tablet by mouth daily.  . [DISCONTINUED] busPIRone (BUSPAR) 10 MG tablet Take 10 mg by mouth 3 (three) times daily.     Allergies:   Milk-related compounds, Other, Prazosin, Sesame seed (diagnostic), and Wheat bran   Social History   Socioeconomic History  . Marital status: Married    Spouse name: Not on file  . Number of children: Not on file  . Years of education: Not on file  . Highest education level: Not on file  Occupational History  . Not on file  Tobacco Use  . Smoking status: Never Smoker  . Smokeless tobacco: Never Used  Substance  and Sexual Activity  . Alcohol use: No  . Drug use: No  . Sexual activity: Not on file  Other Topics Concern  . Not on file  Social History Narrative   Right handed   Two story, has steps   Social Determinants of Health   Financial Resource Strain:   . Difficulty of Paying Living Expenses:   Food Insecurity:   . Worried About Programme researcher, broadcasting/film/video in the Last Year:   . Barista in the Last Year:   Transportation Needs:   . Freight forwarder (Medical):   Marland Kitchen Lack of Transportation (Non-Medical):   Physical Activity:   . Days of Exercise per Week:   . Minutes of Exercise per Session:   Stress:   . Feeling of Stress :   Social Connections:   . Frequency of Communication with Friends and Family:   . Frequency of Social Gatherings with Friends and Family:   . Attends Religious Services:   . Active Member of Clubs or Organizations:   . Attends Banker Meetings:   Marland Kitchen Marital Status:      Family History: The patient's family history includes Allergic rhinitis in her mother, paternal grandfather, and paternal grandmother. There is no history of Eczema, Urticaria, Asthma, or Angioedema.  ROS:   Please see the  history of present illness.    All other systems reviewed and are negative.  EKGs/Labs/Other Studies Reviewed:    The following studies were reviewed today:  EKG:  Patient feels she has had an ecg in the past 6 mo and declines today.   Recent Labs: No results found for requested labs within last 8760 hours.  Recent Lipid Panel No results found for: CHOL, TRIG, HDL, CHOLHDL, VLDL, LDLCALC, LDLDIRECT  Physical Exam:    VS:  BP 110/64 (BP Location: Left Arm, Patient Position: Sitting, Cuff Size: Normal)   Pulse 90   Temp 97.7 F (36.5 C)   Ht 5\' 3"  (1.6 m)   Wt 126 lb (57.2 kg)   BMI 22.32 kg/m     Wt Readings from Last 5 Encounters:  02/26/20 126 lb (57.2 kg)  02/26/20 128 lb (58.1 kg)  08/01/19 130 lb 3.2 oz (59.1 kg)  05/24/19 129 lb (58.5 kg)  05/06/18 129 lb (58.5 kg)    Constitutional: No acute distress Eyes: sclera non-icteric, normal conjunctiva and lids ENMT: normal dentition, moist mucous membranes Cardiovascular: regular rhythm, normal rate, no murmurs. S1 and S2 normal. No jugular venous distention.  Respiratory: clear to auscultation bilaterally GI : normal bowel sounds, soft and nontender. No distention.   MSK: extremities warm, well perfused. No edema.  NEURO: grossly nonfocal exam, moves all extremities. PSYCH: alert and oriented x 3, normal mood and affect.   ASSESSMENT:    1. Syncope and collapse   2. Dizziness   3. Palpitations   4. MDD (major depressive disorder), recurrent severe, without psychosis (HCC)   5. Anxiety   6. Posttraumatic stress disorder   7. Eosinophilic esophagitis   8. Gastroesophageal reflux disease without esophagitis    PLAN:    Syncope and collapse Dizziness Palpitations - She feels that these symptoms are stable from a cardiac standpoint, and is in the process of neurologic workup. She is concerned about a CNS issue as the primary driver of symptoms. She will continue conservative measures for dizziness and syncope as  discussed with 07/06/18, and as documented below. We discussed exercise as an excellent way to  condition the body in the setting of autonomic dysfunction.   MDD (major depressive disorder), recurrent severe, without psychosis ([place]) Anxiety Posttraumatic stress disorder Eosinophilic esophagitis Gastroesophageal reflux disease without esophagitis - Per PCP   The following recommendations were emphasized: -avoid dehydration. Often it requires high volumes of fluids, often with salt/electrolytes included, to stay hydrated. People with POTS are very sensitive to fluid shifts and dehydration. Oral rehydration is preferred, and routine use of IV fluids is not recommended. -if tolerated, compression stocking can assist with fluid management and prevent pooling in the legs. -slow position changes are recommended -if there is a feeling of severe lightheadedness, like near to passing out, recommend lying on the floor on the back, with legs elevated up on a chair or up against the wall. -the best long term management of symptoms is gradual exercise conditioning. I recommend seated exercises such as bike to start, to avoid the risk of falling with lightheadedness. Exercise programs, either through supervised programs like cardiac rehab or through personal programs, should focus on gradually increasing exercise tolerance and conditioning.  -we discussed the typical spectrum of dysautonomia, including typical populations, that this sometimes spontaneously improves with age (though a small percentage have persistent symptoms), that this has uncomfortable symptoms but is not associated with long term mortality, and that the etiology/treatment of this is an area of active research   Total time of encounter: 30 minutes total time of encounter, including 19 minutes spent in face-to-face patient care on the date of this encounter. This time includes coordination of care and counseling regarding above mentioned  problem list. Remainder of non-face-to-face time involved reviewing chart documents/testing relevant to the patient encounter and documentation in the medical record. I have independently reviewed documentation from referring provider.   Cherlynn Kaiser, MD [place]  [facility]    Medication Adjustments/Labs and Tests Ordered: Current medicines are reviewed at length with the patient today.  Concerns regarding medicines are outlined above.  No orders of the defined types were placed in this encounter.  No orders of the defined types were placed in this encounter.   Patient Instructions  Medication Instructions:  No changes *If you need a refill on your cardiac medications before your next appointment, please call your pharmacy*   Lab Work: Not needed   Testing/Procedures: not needed   Follow-Up: At [facility], you and your health needs are our priority.  As part of our continuing mission to provide you with exceptional heart care, we have created designated Provider Care Teams.  These Care Teams include your primary Cardiologist (physician) and Advanced Practice Providers (APPs -  Physician Assistants and Nurse Practitioners) who all work together to provide you with the care you need, when you need it.  We recommend signing up for the patient portal called "MyChart".  Sign up information is provided on this After Visit Summary.  MyChart is used to connect with patients for Virtual Visits (Telemedicine).  Patients are able to view lab/test results, encounter notes, upcoming appointments, etc.  Non-urgent messages can be sent to your provider as well.   To learn more about what you can do with MyChart, go to [email or web address].    Your next appointment:    as needed - per patient preference.  The format for your next appointment:   Either In Person or Virtual  Provider:   Cherlynn Kaiser, MD

## 2020-02-26 NOTE — Patient Instructions (Signed)
Great meeting you!  1. Schedule MRI brain with and without contrast  2. Schedule 1-hour EEG. If normal, would plan for 48-hour EEG  3. Refer for Neurocognitive testing  4. Refer to Vestibular therapy for positional dizziness, gaze stabilization exercises  5. Consider speech/cognitive therapy  6. Discuss POTS concern with your cardiologist. Continue follow-up with Psychiatry/therapy  7. Follow-up in 4 months, call for any changes

## 2020-02-27 ENCOUNTER — Other Ambulatory Visit (INDEPENDENT_AMBULATORY_CARE_PROVIDER_SITE_OTHER): Payer: No Typology Code available for payment source | Admitting: Emergency Medicine

## 2020-02-27 DIAGNOSIS — F332 Major depressive disorder, recurrent severe without psychotic features: Secondary | ICD-10-CM | POA: Diagnosis not present

## 2020-02-27 NOTE — Progress Notes (Signed)
Patient reported to [facility] for Repetitive Transcranial Magnetic Stimulation treatment for severe episode of recurrent major depressive disorder, without psychotic features. Patient presented with appropriate affect, level mood and denied any suicidal or homicidal ideations. Patient denies any other current symptoms and remains optimistic with continued TMS treatment. Patient reported no change in alcohol/substance use, caffeine consumption, sleep pattern or metal implant status since previous tx. Patient reported to tx session with pleasant affect.Powerwas titrated to120% for the duration of txand will remain there until patient gets adjusted. Patient reported no complaints or discomfort. Patientdeparted post-treatment with no concerns or complaints. 

## 2020-02-27 NOTE — Progress Notes (Signed)
PA number [id number] PA window 02/27/20-03/25/20 Tax ID [id number] VO#[id number]

## 2020-02-28 ENCOUNTER — Other Ambulatory Visit (INDEPENDENT_AMBULATORY_CARE_PROVIDER_SITE_OTHER): Payer: No Typology Code available for payment source | Admitting: Emergency Medicine

## 2020-02-28 ENCOUNTER — Other Ambulatory Visit: Payer: Self-pay

## 2020-02-28 DIAGNOSIS — F332 Major depressive disorder, recurrent severe without psychotic features: Secondary | ICD-10-CM

## 2020-02-28 NOTE — Progress Notes (Signed)
Patient reported to [facility] for Repetitive Transcranial Magnetic Stimulation treatment for severe episode of recurrent major depressive disorder, without psychotic features. Patient presented with appropriate affect, level mood and denied any suicidal or homicidal ideations. Patient denies any other current symptoms and remains optimistic with continued TMS treatment. Patient reported no change in alcohol/substance use, caffeine consumption, sleep pattern or metal implant status since previous tx. Patient reported to tx session with pleasant affect.Powerwas titrated to120% for the duration of txand will remain there until patient gets adjusted. Patient reported no complaints or discomfort. Patientdeparted post-treatment with no concerns or complaints. 

## 2020-02-29 ENCOUNTER — Encounter (HOSPITAL_COMMUNITY): Payer: No Typology Code available for payment source

## 2020-03-04 ENCOUNTER — Other Ambulatory Visit (HOSPITAL_COMMUNITY): Payer: No Typology Code available for payment source | Attending: Psychiatry | Admitting: Emergency Medicine

## 2020-03-04 ENCOUNTER — Other Ambulatory Visit: Payer: Self-pay

## 2020-03-04 DIAGNOSIS — F332 Major depressive disorder, recurrent severe without psychotic features: Secondary | ICD-10-CM | POA: Diagnosis not present

## 2020-03-04 NOTE — Progress Notes (Signed)
Patient reported to [facility] for Repetitive Transcranial Magnetic Stimulation treatment for severe episode of recurrent major depressive disorder, without psychotic features. Patient presented with appropriate affect, level mood and denied any suicidal or homicidal ideations. Patient denies any other current symptoms and remains optimistic with continued TMS treatment. Patient reported no change in alcohol/substance use, caffeine consumption, sleep pattern or metal implant status since previous tx. Patient reported to tx session with pleasant affect. Pt spoke with Dr. Lucianne Muss about her symptoms she was experiencing last few weeks. Pt is happy about the advise given by MD.Powerwas titrated to120% for the duration of txand will remain there until patient gets adjusted. Patient reported no complaints or discomfort. Patientdeparted post-treatment with no concerns or complaints

## 2020-03-05 ENCOUNTER — Other Ambulatory Visit (HOSPITAL_COMMUNITY): Payer: No Typology Code available for payment source | Admitting: Emergency Medicine

## 2020-03-05 ENCOUNTER — Other Ambulatory Visit: Payer: Self-pay

## 2020-03-06 ENCOUNTER — Other Ambulatory Visit: Payer: Self-pay

## 2020-03-06 ENCOUNTER — Other Ambulatory Visit (INDEPENDENT_AMBULATORY_CARE_PROVIDER_SITE_OTHER): Payer: No Typology Code available for payment source | Admitting: Emergency Medicine

## 2020-03-06 DIAGNOSIS — F332 Major depressive disorder, recurrent severe without psychotic features: Secondary | ICD-10-CM

## 2020-03-06 NOTE — Progress Notes (Signed)
Patient reported to [facility] for Repetitive Transcranial Magnetic Stimulation treatment for severe episode of recurrent major depressive disorder, without psychotic features. Patient presented with appropriate affect, level mood and denied any suicidal or homicidal ideations. Patient denies any other current symptoms and remains optimistic with continued TMS treatment. Patient reported no change in alcohol/substance use, caffeine consumption, sleep pattern or metal implant status since previous tx. Patient reported to tx session with pleasant affect.Powerwas titrated to120% for the duration of txand will remain there until patient gets adjusted. Patient reported no complaints or discomfort. Patientdeparted post-treatment with no concerns or complaints. 

## 2020-03-07 ENCOUNTER — Ambulatory Visit (INDEPENDENT_AMBULATORY_CARE_PROVIDER_SITE_OTHER): Payer: No Typology Code available for payment source

## 2020-03-07 ENCOUNTER — Encounter (HOSPITAL_COMMUNITY): Payer: No Typology Code available for payment source

## 2020-03-07 DIAGNOSIS — J309 Allergic rhinitis, unspecified: Secondary | ICD-10-CM

## 2020-03-14 ENCOUNTER — Other Ambulatory Visit: Payer: Self-pay

## 2020-03-14 ENCOUNTER — Other Ambulatory Visit (HOSPITAL_COMMUNITY): Payer: No Typology Code available for payment source | Admitting: Emergency Medicine

## 2020-03-14 DIAGNOSIS — F332 Major depressive disorder, recurrent severe without psychotic features: Secondary | ICD-10-CM

## 2020-03-14 NOTE — Progress Notes (Signed)
Patient reported to [facility] for Repetitive Transcranial Magnetic Stimulation treatment for severe episode of recurrent major depressive disorder, without psychotic features. Patient presented with appropriate affect, level mood and denied any suicidal or homicidal ideations. Patient denies any other current symptoms and remains optimistic with continued TMS treatment. Patient reported no change in alcohol/substance use, caffeine consumption, sleep pattern or metal implant status since previous tx. Patient reported to tx session with pleasant affect. Pt experienced a few days where she was tearful. Powerwas titrated to120% for the duration of txand will remain there until patient gets adjusted. Patient reported no complaints or discomfort. Patientdeparted post-treatment with no concerns or complaints

## 2020-03-15 ENCOUNTER — Other Ambulatory Visit: Payer: Self-pay

## 2020-03-15 ENCOUNTER — Other Ambulatory Visit (HOSPITAL_COMMUNITY): Payer: No Typology Code available for payment source | Admitting: Emergency Medicine

## 2020-03-15 DIAGNOSIS — F330 Major depressive disorder, recurrent, mild: Secondary | ICD-10-CM

## 2020-03-15 DIAGNOSIS — F333 Major depressive disorder, recurrent, severe with psychotic symptoms: Secondary | ICD-10-CM

## 2020-03-15 MED FILL — busPIRone HCL 10 MG TABS: 10 | 30 days supply | Qty: 90 | Fill #1

## 2020-03-15 NOTE — Progress Notes (Signed)
Patient reported to [facility] for Repetitive Transcranial Magnetic Stimulation treatment for severe episode of recurrent major depressive disorder, without psychotic features. Patient presented with appropriate affect, level mood and denied any suicidal or homicidal ideations. Patient denies any other current symptoms and remains optimistic with continued TMS treatment. Patient reported no change in alcohol/substance use, caffeine consumption, sleep pattern or metal implant status since previous tx. Patient reported to tx session with pleasant affect.Powerwas titrated to120% for the duration of txand will remain there until patient gets adjusted. Patient reported no complaints or discomfort. Patientdeparted post-treatment with no concerns or complaints. 

## 2020-03-18 ENCOUNTER — Encounter (HOSPITAL_COMMUNITY): Payer: No Typology Code available for payment source | Admitting: Emergency Medicine

## 2020-03-19 ENCOUNTER — Other Ambulatory Visit: Payer: Self-pay

## 2020-03-19 ENCOUNTER — Ambulatory Visit (INDEPENDENT_AMBULATORY_CARE_PROVIDER_SITE_OTHER): Payer: No Typology Code available for payment source | Admitting: Neurology

## 2020-03-19 DIAGNOSIS — F0781 Postconcussional syndrome: Secondary | ICD-10-CM

## 2020-03-19 DIAGNOSIS — R42 Dizziness and giddiness: Secondary | ICD-10-CM

## 2020-03-19 DIAGNOSIS — R55 Syncope and collapse: Secondary | ICD-10-CM | POA: Diagnosis not present

## 2020-03-19 DIAGNOSIS — R4189 Other symptoms and signs involving cognitive functions and awareness: Secondary | ICD-10-CM

## 2020-03-20 ENCOUNTER — Ambulatory Visit (INDEPENDENT_AMBULATORY_CARE_PROVIDER_SITE_OTHER): Payer: No Typology Code available for payment source

## 2020-03-20 DIAGNOSIS — J309 Allergic rhinitis, unspecified: Secondary | ICD-10-CM

## 2020-03-20 MED FILL — ARMODAFINIL 50 MG TABLET: 50 | 30 days supply | Qty: 60 | Fill #0

## 2020-03-22 ENCOUNTER — Other Ambulatory Visit: Payer: Self-pay

## 2020-03-22 ENCOUNTER — Other Ambulatory Visit (INDEPENDENT_AMBULATORY_CARE_PROVIDER_SITE_OTHER): Payer: No Typology Code available for payment source | Admitting: Emergency Medicine

## 2020-03-22 DIAGNOSIS — F332 Major depressive disorder, recurrent severe without psychotic features: Secondary | ICD-10-CM | POA: Diagnosis not present

## 2020-03-22 NOTE — Progress Notes (Signed)
Patient reported to [facility] for Repetitive Transcranial Magnetic Stimulation treatment for severe episode of recurrent major depressive disorder, without psychotic features. Patient presented with appropriate affect, level mood and denied any suicidal or homicidal ideations. Patient denies any other current symptoms and remains optimistic with continued TMS treatment. Patient reported no change in alcohol/substance use, caffeine consumption, sleep pattern or metal implant status since previous tx. Patient reported to tx session with pleasant affect.Powerwas titrated to120% for the duration of txand will remain there until patient gets adjusted. Patient reported no complaints or discomfort. Patientdeparted post-treatment with no concerns or complaints. 

## 2020-03-25 NOTE — Procedures (Signed)
ELECTROENCEPHALOGRAM REPORT  Date of Study: 03/19/2020  Patient's Name: Patricia Harvey MRN: [id number] Date of Birth: 12/02/1990  Referring Provider: Dr. Patrcia Dolly  Clinical History: This is a 29 year old woman with recurrent syncope, cognitive and behavioral changes.   Medications: LAMICTAL 25 MG tablet VENTOLIN HFA 108 (90 Base) MCG/ACT inhaler Armodafinil 50 MG tablet ASTELIN 0.1 % nasal spray Bromelains 500 MG TABS6 BUSPAR 10 MG tablet BUSPAR 5 MG tablet FLEXERIL 10 MG tablet FLONASE 50 MCG/ACT nasal spray XYZAL 5 MG tablet MOBIC 15 MG tablet TURMERIC PO Armodafinil 150 MG tablet INDERAL 10 MG tablet  Technical Summary: A multichannel digital 1-hour EEG recording measured by the international 10-20 system with electrodes applied with paste and impedances below 5000 ohms performed in our laboratory with EKG monitoring in an awake and asleep patient.  Hyperventilation was not performed. Photic stimulation was performed.  The digital EEG was referentially recorded, reformatted, and digitally filtered in a variety of bipolar and referential montages for optimal display.    Description: The patient is awake and asleep during the recording.  During maximal wakefulness, there is a symmetric, medium voltage 9.5 Hz posterior dominant rhythm that attenuates with eye opening.  The record is symmetric.  During drowsiness and sleep, there is an increase in theta slowing of the background.  Vertex waves and symmetric sleep spindles were seen.  Photic stimulation did not elicit any abnormalities.  There were no epileptiform discharges or electrographic seizures seen.    EKG lead was unremarkable.  Impression: This 1-hour awake and asleep EEG is normal.    Clinical Correlation: A normal EEG does not exclude a clinical diagnosis of epilepsy.  If further clinical questions remain, prolonged EEG may be helpful.  Clinical correlation is advised.   Patrcia Dolly, M.D.

## 2020-03-27 NOTE — Progress Notes (Signed)
Pt called no answer voice mail was full

## 2020-03-28 ENCOUNTER — Other Ambulatory Visit (HOSPITAL_COMMUNITY): Payer: No Typology Code available for payment source | Admitting: Emergency Medicine

## 2020-03-28 ENCOUNTER — Other Ambulatory Visit: Payer: Self-pay

## 2020-03-28 DIAGNOSIS — F332 Major depressive disorder, recurrent severe without psychotic features: Secondary | ICD-10-CM

## 2020-03-28 NOTE — Progress Notes (Signed)
Patient reported to [facility] for Repetitive Transcranial Magnetic Stimulation treatment for severe episode of recurrent major depressive disorder, without psychotic features. Patient presented with appropriate affect, level mood and denied any suicidal or homicidal ideations. Patient denies any other current symptoms and remains optimistic with continued TMS treatment. Patient reported no change in alcohol/substance use, caffeine consumption, sleep pattern or metal implant status since previous tx. Patient reported to tx session with pleasant affect. Today is patient's last day. Pt scored a 3. She stated TMS has significantly helped improve her quality of life. Powerwas titrated to120% for the duration of txand will remain there until patient gets adjusted. Patient reported no complaints or discomfort. Patientdeparted post-treatment with no concerns or complaints

## 2020-03-29 ENCOUNTER — Telehealth: Payer: Self-pay

## 2020-03-29 ENCOUNTER — Telehealth: Payer: Self-pay | Admitting: Neurology

## 2020-03-29 NOTE — Telephone Encounter (Signed)
-----   Message from Van Clines, MD sent at 03/27/2020  3:06 PM EDT ----- Pls let her know EEG normal. Between the MRI brain and prolonged EEG, I would start with MRI first. Thanks

## 2020-03-29 NOTE — Telephone Encounter (Signed)
Patient noticed a missed call from our office a few days ago. Her voicemail was full so there was no message. She stated she had an EEG recently and felt the call may have been the results. She is available until 3 PM today.

## 2020-03-29 NOTE — Telephone Encounter (Signed)
Pt called back see result notes  °

## 2020-03-29 NOTE — Telephone Encounter (Signed)
Pls let her know EEG normal. Between the MRI brain and prolonged EEG, I would start with MRI first. Pt stated that she will do the MRI first

## 2020-04-02 ENCOUNTER — Ambulatory Visit (INDEPENDENT_AMBULATORY_CARE_PROVIDER_SITE_OTHER): Payer: No Typology Code available for payment source

## 2020-04-02 DIAGNOSIS — J309 Allergic rhinitis, unspecified: Secondary | ICD-10-CM

## 2020-04-04 ENCOUNTER — Other Ambulatory Visit: Payer: Self-pay

## 2020-04-04 ENCOUNTER — Encounter: Payer: Self-pay | Admitting: Allergy

## 2020-04-04 ENCOUNTER — Ambulatory Visit (INDEPENDENT_AMBULATORY_CARE_PROVIDER_SITE_OTHER): Payer: No Typology Code available for payment source | Admitting: Allergy

## 2020-04-04 VITALS — BP 110/70 | HR 93 | Temp 98.2°F | Resp 16 | Ht 63.0 in | Wt 129.0 lb

## 2020-04-04 DIAGNOSIS — J3089 Other allergic rhinitis: Secondary | ICD-10-CM

## 2020-04-04 DIAGNOSIS — R490 Dysphonia: Secondary | ICD-10-CM | POA: Diagnosis not present

## 2020-04-04 DIAGNOSIS — K200 Eosinophilic esophagitis: Secondary | ICD-10-CM

## 2020-04-04 DIAGNOSIS — H1013 Acute atopic conjunctivitis, bilateral: Secondary | ICD-10-CM | POA: Diagnosis not present

## 2020-04-04 NOTE — Patient Instructions (Addendum)
Allergic rhinitis with conjunctivitis   - symptoms improving on immunotherapy   - Continue avoidance measures   - Continue xyzal daily.   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  - continue current dietary avoidance for management of GI symptoms  Dysphonia   - Continue speech therapy sessions as directed   Follow-up 12 months for follow-up or sooner if needed

## 2020-04-04 NOTE — Progress Notes (Signed)
Follow-up Note  RE: Patricia Harvey MRN: [id number] DOB: 12/29/1990 Date of Office Visit: 04/04/2020   History of present illness: Patricia Harvey is a 29 y.o. female presenting today for follow-up of allergic rhinitis with conjunctivitis, EOE.  She was last seen in the office on 05/25/2019 by myself.  At that time she was having significant issues with shortness of breath's, sore throat, hoarseness and dizziness.  She did have a negative Covid antibody testing however symptoms did seem consistent with Covid symptoms.  At that time recommended that she could use Symbicort to help with the shortness of breath as well as as needed albuterol.  She did continue with speech therapy which she states has been the most helpful for her.  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  She states the symptoms have greatly improved and she is about 80 to 85% better.  She is not using Symbicort at this time. The best news since her last visit is her recent EGD showed eosinophil count of less than 15 per high-powered field.  With her EOE she continues on a dietary avoidance and has had a really good success with this. With her allergies she is on immunotherapy at this time at every 2 weeks.  She is tolerating the injections well.  She states she can note improvement in her symptoms between last season this season.  She currently just takes Xyzal and performs nasal saline rinses.  Review of systems: Review of Systems  Constitutional: Negative.   HENT: Negative.   Eyes: Negative.   Respiratory: Negative.   Cardiovascular: Negative.   Gastrointestinal: Negative.   Musculoskeletal: Negative.   Skin: Negative.   Neurological: Negative.     All other systems negative unless noted above in HPI  Past medical/social/surgical/family history have been reviewed and are unchanged unless specifically indicated below.  No changes  Medication List: Current Outpatient Medications    Medication Sig Dispense Refill  . albuterol (VENTOLIN HFA) 108 (90 Base) MCG/ACT inhaler Inhale 2 puffs into the lungs every 4 (four) hours as needed for wheezing or shortness of breath. 18 g 2  . Armodafinil 50 MG tablet Take 100 mg by mouth daily.    Marland Kitchen azelastine (ASTELIN) 0.1 % nasal spray Place into the nose.    . Bromelains 500 MG TABS Take by mouth.    . busPIRone (BUSPAR) 5 MG tablet Take 5 mg by mouth daily.     . cyclobenzaprine (FLEXERIL) 10 MG tablet Take 10 mg by mouth as needed.     . fluticasone (FLONASE) 50 MCG/ACT nasal spray 2 sprays by Each Nare route daily.    Marland Kitchen lamoTRIgine (LAMICTAL) 25 MG tablet Take 100 mg by mouth every morning.    Marland Kitchen levocetirizine (XYZAL) 5 MG tablet Take 5 mg by mouth every evening.    . meloxicam (MOBIC) 15 MG tablet Take 15 mg by mouth as needed.     . propranolol (INDERAL) 10 MG tablet Take 10 mg by mouth as needed.     . TURMERIC PO Take 1 tablet by mouth daily.    . Armodafinil 150 MG tablet      No current facility-administered medications for this visit.     Known medication allergies: Allergies  Allergen Reactions  . Milk-Related Compounds Diarrhea    Cramping  . Other Other (See Comments)    Steroids, panic attack  . Prazosin     Syncope  . Sesame Seed (  Diagnostic) Diarrhea, Nausea Only and Other (See Comments)    cramping  . Wheat Bran Diarrhea    Cramping     Physical examination: Blood pressure 110/70, pulse 93, temperature 98.2 F (36.8 C), temperature source Temporal, resp. rate 16, height 5\' 3"  (1.6 m), weight 129 lb (58.5 kg), SpO2 99 %.  General: Alert, interactive, in no acute distress. HEENT: PERRLA, TMs pearly gray, turbinates non-edematous without discharge, post-pharynx non erythematous. Neck: Supple without lymphadenopathy. Lungs: Clear to auscultation without wheezing, rhonchi or rales. {no increased work of breathing. CV: Normal S1, S2 without murmurs. Abdomen: Nondistended, nontender. Skin: Warm and dry,  without lesions or rashes. Extremities:  No clubbing, cyanosis or edema. Neuro:   Grossly intact.  Diagnositics/Labs: None today  Assessment and plan:   Allergic rhinitis with conjunctivitis   - symptoms improving on immunotherapy   - Continue avoidance measures   - Continue xyzal daily.   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  - continue current dietary avoidance for management of GI symptoms  Dysphonia   - Continue speech therapy sessions as directed  Follow-up 12 months for follow-up or sooner if needed  I appreciate the opportunity to take part in [place] care. Please do not hesitate to contact me with questions.  Sincerely,   TALENCE, MD Allergy/Immunology [facility]

## 2020-04-10 NOTE — Progress Notes (Signed)
Change of date letter authorization letter 04/10/20 - 05/10/20 Authorization number [id number] Letter sent to scan

## 2020-04-12 ENCOUNTER — Other Ambulatory Visit: Payer: Self-pay

## 2020-04-12 ENCOUNTER — Ambulatory Visit
Admission: RE | Admit: 2020-04-12 | Discharge: 2020-04-12 | Disposition: A | Discharge status: Home / Self Care | Payer: No Typology Code available for payment source | Source: Ambulatory Visit | Attending: Neurology | Admitting: Neurology

## 2020-04-12 DIAGNOSIS — R4689 Other symptoms and signs involving appearance and behavior: Secondary | ICD-10-CM

## 2020-04-12 DIAGNOSIS — F0781 Postconcussional syndrome: Secondary | ICD-10-CM

## 2020-04-12 DIAGNOSIS — R55 Syncope and collapse: Secondary | ICD-10-CM

## 2020-04-12 DIAGNOSIS — R4189 Other symptoms and signs involving cognitive functions and awareness: Secondary | ICD-10-CM

## 2020-04-12 DIAGNOSIS — R42 Dizziness and giddiness: Secondary | ICD-10-CM

## 2020-04-12 MED ORDER — GADOBENATE DIMEGLUMINE 529 MG/ML IV SOLN
11.0000 mL | Freq: Once | INTRAVENOUS | Status: AC | PRN
  Administered 2020-04-12: 11 mL via INTRAVENOUS

## 2020-04-17 ENCOUNTER — Ambulatory Visit (INDEPENDENT_AMBULATORY_CARE_PROVIDER_SITE_OTHER): Payer: No Typology Code available for payment source | Admitting: *Deleted

## 2020-04-17 ENCOUNTER — Telehealth: Payer: Self-pay | Admitting: Allergy

## 2020-04-17 DIAGNOSIS — J309 Allergic rhinitis, unspecified: Secondary | ICD-10-CM

## 2020-04-17 NOTE — Telephone Encounter (Signed)
Spoke with patient and she was confused as to why she is weekly and not every other week. I explained to her that every time she starts a new vial she is weekly for 5 weeks then resumes the every other week. I informed her she started a new vial in march and a couple times we had to repeat her dose due to it being past the 13 day mark. Patient stated she was not aware she started a new vial or that she had repeated doses until today when she received her injection and was told she had to repeat due to time and patient didn't understand why. Ashleigh tried to explain it however patient was still confused. After speaking with patient and explaining everything patient verbalized understanding.

## 2020-04-17 NOTE — Telephone Encounter (Signed)
Patient has questions about her vials and the frequency she is supposed to get them. She spoke to the injection room today, but needs more clarity on when and if she is on, or will be on maintenance.

## 2020-04-17 NOTE — Telephone Encounter (Signed)
Will call and speak with patient shortly.

## 2020-04-18 MED FILL — ARMODAFINIL 50 MG TABLET: 50 | 30 days supply | Qty: 60 | Fill #1

## 2020-04-22 ENCOUNTER — Telehealth: Payer: Self-pay

## 2020-04-22 ENCOUNTER — Ambulatory Visit (INDEPENDENT_AMBULATORY_CARE_PROVIDER_SITE_OTHER): Payer: No Typology Code available for payment source

## 2020-04-22 DIAGNOSIS — J309 Allergic rhinitis, unspecified: Secondary | ICD-10-CM | POA: Diagnosis not present

## 2020-04-22 NOTE — Telephone Encounter (Signed)
Patient came in today to get her .50 and was a little frustrated because she did not know she was suppose to start back coming once a week once she start a new red vial, she was still coming every 2 weeks and because of that she would have to repeat the dose so she never really made it to .50. Beth and I explained that when she has to start a new red vial because the old vial is completed she needs to come every week. I was also told that she has been explained this before. So I'm flagging her chart to make sure she is told when a new red vial has been started and for her to come once a week.

## 2020-04-23 NOTE — Telephone Encounter (Signed)
She received .74mL yesterday so she is now officially back to her every 2 weeks.

## 2020-04-23 NOTE — Telephone Encounter (Signed)
Ok thanks.  I'm fine with her doing 0.1, 0.3, 0.5 mls once a week for 3 weeks to get back to maintenance dose quicker.

## 2020-04-26 MED FILL — busPIRone HCL 10 MG TABS: 10 | 30 days supply | Qty: 90 | Fill #0

## 2020-05-07 ENCOUNTER — Ambulatory Visit (INDEPENDENT_AMBULATORY_CARE_PROVIDER_SITE_OTHER): Payer: No Typology Code available for payment source

## 2020-05-07 DIAGNOSIS — J309 Allergic rhinitis, unspecified: Secondary | ICD-10-CM | POA: Diagnosis not present

## 2020-05-20 NOTE — Progress Notes (Signed)
EXP 05/21/21 

## 2020-05-21 DIAGNOSIS — J3081 Allergic rhinitis due to animal (cat) (dog) hair and dander: Secondary | ICD-10-CM | POA: Diagnosis not present

## 2020-05-21 MED FILL — OSCIMIN SL 0.125 MG TABLET: 0.125 | 40 days supply | Qty: 120 | Fill #0

## 2020-05-22 ENCOUNTER — Other Ambulatory Visit: Payer: Self-pay | Admitting: Obstetrics & Gynecology

## 2020-05-22 DIAGNOSIS — N6320 Unspecified lump in the left breast, unspecified quadrant: Secondary | ICD-10-CM

## 2020-05-23 ENCOUNTER — Ambulatory Visit (INDEPENDENT_AMBULATORY_CARE_PROVIDER_SITE_OTHER): Payer: No Typology Code available for payment source

## 2020-05-23 ENCOUNTER — Encounter: Payer: Self-pay | Admitting: Addiction (Substance Use Disorder)

## 2020-05-23 DIAGNOSIS — J309 Allergic rhinitis, unspecified: Secondary | ICD-10-CM

## 2020-05-27 ENCOUNTER — Other Ambulatory Visit: Payer: Self-pay | Admitting: Obstetrics & Gynecology

## 2020-05-27 ENCOUNTER — Other Ambulatory Visit: Payer: No Typology Code available for payment source

## 2020-05-27 ENCOUNTER — Ambulatory Visit
Admission: RE | Admit: 2020-05-27 | Discharge: 2020-05-27 | Disposition: A | Discharge status: Home / Self Care | Payer: No Typology Code available for payment source | Source: Ambulatory Visit | Attending: Obstetrics & Gynecology | Admitting: Obstetrics & Gynecology

## 2020-05-27 ENCOUNTER — Other Ambulatory Visit: Payer: Self-pay

## 2020-05-27 DIAGNOSIS — N6320 Unspecified lump in the left breast, unspecified quadrant: Secondary | ICD-10-CM

## 2020-05-28 ENCOUNTER — Other Ambulatory Visit: Payer: Self-pay | Admitting: Obstetrics & Gynecology

## 2020-05-28 DIAGNOSIS — Z9189 Other specified personal risk factors, not elsewhere classified: Secondary | ICD-10-CM

## 2020-05-30 ENCOUNTER — Other Ambulatory Visit: Payer: No Typology Code available for payment source

## 2020-06-06 ENCOUNTER — Other Ambulatory Visit: Payer: No Typology Code available for payment source

## 2020-06-06 ENCOUNTER — Ambulatory Visit (INDEPENDENT_AMBULATORY_CARE_PROVIDER_SITE_OTHER): Payer: No Typology Code available for payment source

## 2020-06-06 DIAGNOSIS — J309 Allergic rhinitis, unspecified: Secondary | ICD-10-CM | POA: Diagnosis not present

## 2020-06-12 ENCOUNTER — Other Ambulatory Visit: Payer: No Typology Code available for payment source

## 2020-06-14 ENCOUNTER — Ambulatory Visit
Admission: RE | Admit: 2020-06-14 | Discharge: 2020-06-14 | Disposition: A | Discharge status: Home / Self Care | Payer: No Typology Code available for payment source | Source: Ambulatory Visit | Attending: Obstetrics & Gynecology | Admitting: Obstetrics & Gynecology

## 2020-06-14 ENCOUNTER — Other Ambulatory Visit: Payer: Self-pay

## 2020-06-14 DIAGNOSIS — Z9189 Other specified personal risk factors, not elsewhere classified: Secondary | ICD-10-CM

## 2020-06-14 MED ORDER — GADOBUTROL 1 MMOL/ML IV SOLN
6.0000 mL | Freq: Once | INTRAVENOUS | Status: AC | PRN
  Administered 2020-06-14: 6 mL via INTRAVENOUS

## 2020-06-24 ENCOUNTER — Ambulatory Visit (INDEPENDENT_AMBULATORY_CARE_PROVIDER_SITE_OTHER): Payer: No Typology Code available for payment source

## 2020-06-24 DIAGNOSIS — J309 Allergic rhinitis, unspecified: Secondary | ICD-10-CM

## 2020-06-25 ENCOUNTER — Other Ambulatory Visit: Payer: Self-pay

## 2020-06-25 ENCOUNTER — Encounter: Payer: Self-pay | Admitting: Neurology

## 2020-06-25 ENCOUNTER — Ambulatory Visit (INDEPENDENT_AMBULATORY_CARE_PROVIDER_SITE_OTHER): Payer: No Typology Code available for payment source | Admitting: Neurology

## 2020-06-25 ENCOUNTER — Telehealth (HOSPITAL_COMMUNITY): Payer: Self-pay

## 2020-06-25 VITALS — BP 121/69 | HR 86 | Ht 63.0 in | Wt 126.0 lb

## 2020-06-25 DIAGNOSIS — R42 Dizziness and giddiness: Secondary | ICD-10-CM

## 2020-06-25 DIAGNOSIS — R55 Syncope and collapse: Secondary | ICD-10-CM

## 2020-06-25 DIAGNOSIS — F0781 Postconcussional syndrome: Secondary | ICD-10-CM | POA: Diagnosis not present

## 2020-06-25 MED FILL — ARMODAFINIL 50 MG TABLET: 50 | 30 days supply | Qty: 60 | Fill #2

## 2020-06-25 NOTE — Telephone Encounter (Signed)
Pt stated that she completed her second round of TMS therapy this past April. Pt is feeling her depression returning. On a scale of 1-10, 10 being the worst, pt rates her depression a 3/10 at this time. States that she wants to get a head of it. Pt is currently seeing an outpatient provider and has increased her medication dosage. Pt would like to know if she would require additional TMS sessions, but does not want to do a full round of 36 sessions.

## 2020-06-25 NOTE — Patient Instructions (Signed)
Great to see you are feeling better! Try Vestibular therapy and see if this helps with your symptoms. Follow-up as needed, call for any changes.
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
## 2020-06-26 NOTE — Telephone Encounter (Signed)
We would have to see what authorization we can get through insurance

## 2020-07-11 ENCOUNTER — Ambulatory Visit (INDEPENDENT_AMBULATORY_CARE_PROVIDER_SITE_OTHER): Payer: No Typology Code available for payment source

## 2020-07-11 DIAGNOSIS — J309 Allergic rhinitis, unspecified: Secondary | ICD-10-CM

## 2020-07-18 ENCOUNTER — Telehealth (HOSPITAL_COMMUNITY): Payer: Self-pay

## 2020-07-18 MED FILL — busPIRone HCL 10 MG TABS: 10 | 30 days supply | Qty: 90 | Fill #0

## 2020-07-18 NOTE — Telephone Encounter (Signed)
Outgoing call to patient to follow up on the state of the depressive symptoms and how she is adjusting to her medication changes. Patient's mailbox is full. Will try to call again at a later time.

## 2020-07-22 ENCOUNTER — Ambulatory Visit (INDEPENDENT_AMBULATORY_CARE_PROVIDER_SITE_OTHER): Payer: No Typology Code available for payment source

## 2020-07-22 DIAGNOSIS — J309 Allergic rhinitis, unspecified: Secondary | ICD-10-CM

## 2020-07-24 MED FILL — ONDANSETRON HCL 4 MG TABS: 4 | 30 days supply | Qty: 30 | Fill #0

## 2020-07-24 MED FILL — ARMODAFINIL 50 MG TABLET: 50 | 30 days supply | Qty: 60 | Fill #0

## 2020-08-01 ENCOUNTER — Ambulatory Visit (INDEPENDENT_AMBULATORY_CARE_PROVIDER_SITE_OTHER): Payer: No Typology Code available for payment source

## 2020-08-01 DIAGNOSIS — J309 Allergic rhinitis, unspecified: Secondary | ICD-10-CM

## 2020-08-07 ENCOUNTER — Ambulatory Visit (INDEPENDENT_AMBULATORY_CARE_PROVIDER_SITE_OTHER): Payer: No Typology Code available for payment source

## 2020-08-07 DIAGNOSIS — J309 Allergic rhinitis, unspecified: Secondary | ICD-10-CM | POA: Diagnosis not present

## 2020-08-13 ENCOUNTER — Ambulatory Visit (INDEPENDENT_AMBULATORY_CARE_PROVIDER_SITE_OTHER): Payer: No Typology Code available for payment source | Admitting: *Deleted

## 2020-08-13 DIAGNOSIS — J309 Allergic rhinitis, unspecified: Secondary | ICD-10-CM

## 2020-08-22 ENCOUNTER — Ambulatory Visit (INDEPENDENT_AMBULATORY_CARE_PROVIDER_SITE_OTHER): Payer: No Typology Code available for payment source

## 2020-08-22 DIAGNOSIS — J309 Allergic rhinitis, unspecified: Secondary | ICD-10-CM

## 2020-09-04 ENCOUNTER — Ambulatory Visit (INDEPENDENT_AMBULATORY_CARE_PROVIDER_SITE_OTHER): Payer: No Typology Code available for payment source | Admitting: *Deleted

## 2020-09-04 DIAGNOSIS — J309 Allergic rhinitis, unspecified: Secondary | ICD-10-CM | POA: Diagnosis not present

## 2020-09-09 ENCOUNTER — Other Ambulatory Visit (HOSPITAL_COMMUNITY): Payer: No Typology Code available for payment source | Attending: Psychiatry

## 2020-09-09 ENCOUNTER — Other Ambulatory Visit: Payer: Self-pay

## 2020-09-09 ENCOUNTER — Encounter (HOSPITAL_COMMUNITY): Payer: Self-pay

## 2020-09-09 DIAGNOSIS — F332 Major depressive disorder, recurrent severe without psychotic features: Secondary | ICD-10-CM

## 2020-09-09 NOTE — Progress Notes (Signed)
Pt reported to [facility] for cortical mapping and motor threshold determination for Repetitive Transcranial Magnetic Stimulation treatment for Major Depressive Disorder. Prior to procedure, pt signed an informed consent agreement for TMS treatment. Pt's treatment area was found by applying single pulses to her left motor cortex, hunting along the anterior/posterior plane and along the superior oblique angle until the best motor response was elicited from the pt's right thumb. The best response was observed at 4.5cm A/P and 30 degrees SOA, with a coil angle of 0 degrees. Pt's motor threshold was calculated using the [employer]'s proprietary MT Assist algorithm, which produced a calculated motor threshold of 1.18 SMT. Per these findings, pt's treatment parameters are as follows: A/P -- 10 cm, SOA -- 30 degrees, Coil Angle --  0 degrees, Motor Threshold -- 1.08 SMT. With these parameters, the pt will receive 15 sessions of TMS according to the following protocol: 3000 pulses per session, with stimulation in bursts of pulses lasting 4 seconds at a frequency of 10 Hz, separated by 26 seconds of rest. After determining pt's tx parameters, coil was moved to the treatment location, and the first burst of pulses was applied at a reduced power of 80%MT. Pt reported no complaints, and stated that the stimulation was tolerable. Upon completion of mapping, pt completed a few treatment intervals for observation of side effects. Pt tolerated tx well. Pt departed from clinic without issue.

## 2020-09-11 ENCOUNTER — Other Ambulatory Visit (INDEPENDENT_AMBULATORY_CARE_PROVIDER_SITE_OTHER): Payer: No Typology Code available for payment source

## 2020-09-11 ENCOUNTER — Other Ambulatory Visit: Payer: Self-pay

## 2020-09-11 DIAGNOSIS — F332 Major depressive disorder, recurrent severe without psychotic features: Secondary | ICD-10-CM

## 2020-09-11 NOTE — Progress Notes (Signed)
Patient reported to [facility] for Repetitive Transcranial Magnetic Stimulation treatment for severe episode of recurrent major depressive disorder, without psychotic features. Pt completed a PHQ-9 with a score of 16 (moderately severe depression). Pt also completed a Beck's Depression Inventory with a score of 20 (borderline clinical depression). Patient presented with appropriate affect, level mood and denied any suicidal or homicidal ideations. Patient denies any other current symptoms and remains optimistic with continued TMS treatment. Patient reported no change in alcohol/substance use, caffeine consumption, sleep pattern or metal implant status since previous tx. Power was titrated to 120% for the duration of tx and will remain there until patient gets adjusted. Patient reported no complaints or discomfort. Patient departed post-treatment with no concerns or complaints.

## 2020-09-12 ENCOUNTER — Other Ambulatory Visit (INDEPENDENT_AMBULATORY_CARE_PROVIDER_SITE_OTHER): Payer: No Typology Code available for payment source

## 2020-09-12 DIAGNOSIS — F332 Major depressive disorder, recurrent severe without psychotic features: Secondary | ICD-10-CM

## 2020-09-12 NOTE — Progress Notes (Signed)
Patient reported to [facility] for Repetitive Transcranial Magnetic Stimulation treatment for severe episode of recurrent major depressive disorder, without psychotic features. Patient presented with appropriate affect, level mood and denied any suicidal or homicidal ideations. Patient denies any other current symptoms and remains optimistic with continued TMS treatment. Patient reported no change in alcohol/substance use, caffeine consumption, sleep pattern or metal implant status since previous tx. Power was titrated to 120% for the duration of tx and will remain there until patient gets adjusted. PatienPatient reported no complaints or discomfort. Patient departed post-treatment with no concerns or complaints 

## 2020-09-13 ENCOUNTER — Other Ambulatory Visit (INDEPENDENT_AMBULATORY_CARE_PROVIDER_SITE_OTHER): Payer: No Typology Code available for payment source

## 2020-09-13 ENCOUNTER — Other Ambulatory Visit: Payer: Self-pay

## 2020-09-13 DIAGNOSIS — F332 Major depressive disorder, recurrent severe without psychotic features: Secondary | ICD-10-CM

## 2020-09-13 NOTE — Progress Notes (Signed)
Patient reported to [facility] for Repetitive Transcranial Magnetic Stimulation treatment for severe episode of recurrent major depressive disorder, without psychotic features. Patient presented with appropriate affect, level mood and denied any suicidal or homicidal ideations. Patient denies any other current symptoms and remains optimistic with continued TMS treatment. Patient reported no change in alcohol/substance use, caffeine consumption, sleep pattern or metal implant status since previous tx. Power was titrated to 120% for the duration of tx and will remain there until patient gets adjusted. PatienPatient reported no complaints or discomfort. Patient departed post-treatment with no concerns or complaints

## 2020-09-16 ENCOUNTER — Other Ambulatory Visit: Payer: Self-pay

## 2020-09-16 ENCOUNTER — Other Ambulatory Visit (INDEPENDENT_AMBULATORY_CARE_PROVIDER_SITE_OTHER): Payer: No Typology Code available for payment source

## 2020-09-16 DIAGNOSIS — F332 Major depressive disorder, recurrent severe without psychotic features: Secondary | ICD-10-CM | POA: Diagnosis not present

## 2020-09-16 NOTE — Progress Notes (Signed)
Patient reported to [facility] for Repetitive Transcranial Magnetic Stimulation treatment for severe episode of recurrent major depressive disorder, without psychotic features. Patient presented with appropriate affect, level mood and denied any suicidal or homicidal ideations. Patient denies any other current symptoms and remains optimistic with continued TMS treatment. Patient reported no change in alcohol/substance use, caffeine consumption, sleep pattern or metal implant status since previous tx. Power was titrated to 120% for the duration of tx and will remain there until patient gets adjusted.Patien reported no complaints or discomfort. Patient departed post-treatment with no concerns or complaints.

## 2020-09-17 ENCOUNTER — Other Ambulatory Visit: Payer: Self-pay

## 2020-09-17 ENCOUNTER — Other Ambulatory Visit (INDEPENDENT_AMBULATORY_CARE_PROVIDER_SITE_OTHER): Payer: No Typology Code available for payment source

## 2020-09-17 DIAGNOSIS — F332 Major depressive disorder, recurrent severe without psychotic features: Secondary | ICD-10-CM

## 2020-09-17 NOTE — Progress Notes (Signed)
Patient reported to [facility] for Repetitive Transcranial Magnetic Stimulation treatment for severe episode of recurrent major depressive disorder, without psychotic features. Patient presented with appropriate affect, level mood and denied any suicidal or homicidal ideations. Patient denies any other current symptoms and remains optimistic with continued TMS treatment. Patient reported no change in alcohol/substance use, caffeine consumption, sleep pattern or metal implant status since previous tx. Power was titrated to 120% for the duration of tx and will remain there until patient gets adjusted.Patien reported no complaints or discomfort. Patient departed post-treatment with no concerns or complaints.  

## 2020-09-18 ENCOUNTER — Encounter (HOSPITAL_COMMUNITY): Payer: No Typology Code available for payment source

## 2020-09-19 ENCOUNTER — Other Ambulatory Visit (INDEPENDENT_AMBULATORY_CARE_PROVIDER_SITE_OTHER): Payer: No Typology Code available for payment source

## 2020-09-19 ENCOUNTER — Other Ambulatory Visit: Payer: Self-pay

## 2020-09-19 ENCOUNTER — Ambulatory Visit (INDEPENDENT_AMBULATORY_CARE_PROVIDER_SITE_OTHER): Payer: No Typology Code available for payment source

## 2020-09-19 DIAGNOSIS — J309 Allergic rhinitis, unspecified: Secondary | ICD-10-CM | POA: Diagnosis not present

## 2020-09-19 DIAGNOSIS — F332 Major depressive disorder, recurrent severe without psychotic features: Secondary | ICD-10-CM

## 2020-09-19 NOTE — Progress Notes (Addendum)
Patient reported to  [facility] for Repetitive Transcranial Magnetic Stimulation treatment for severe episode of recurrent major depressive disorder, without psychotic features. Patient presented with appropriate affect, level mood and denied any suicidal or homicidal ideations. Patient denies any other current symptoms and remains optimistic with continued TMS treatment. Patient reported no change in alcohol/substance use, caffeine consumption, sleep pattern or metal implant status since previous tx. Power was titrated to120% for the duration of tx and will remain there until patient gets adjusted. Patient reported no complaints or discomfort. Patient departed post-treatment with no concerns or complaints. 

## 2020-09-20 ENCOUNTER — Other Ambulatory Visit (INDEPENDENT_AMBULATORY_CARE_PROVIDER_SITE_OTHER): Payer: No Typology Code available for payment source

## 2020-09-20 ENCOUNTER — Other Ambulatory Visit: Payer: Self-pay

## 2020-09-20 DIAGNOSIS — F332 Major depressive disorder, recurrent severe without psychotic features: Secondary | ICD-10-CM

## 2020-09-20 NOTE — Progress Notes (Signed)
Patient reported to [facility] for Repetitive Transcranial Magnetic Stimulation treatment for severe episode of recurrent major depressive disorder, without psychotic features. Pt completed a PHQ-9 with a score of 7. Patient presented with appropriate affect, level mood and denied any suicidal or homicidal ideations. Patient denies any other current symptoms and remains optimistic with continued TMS treatment. Patient reported no change in alcohol/substance use, caffeine consumption, sleep pattern or metal implant status since previous tx. Power was titrated to 120% for the duration of tx and will remain there until patient gets adjusted. Patient reported no complaints or discomfort. Patient departed post-treatment with no concerns or complaints.

## 2020-09-23 ENCOUNTER — Other Ambulatory Visit: Payer: Self-pay

## 2020-09-23 ENCOUNTER — Other Ambulatory Visit (INDEPENDENT_AMBULATORY_CARE_PROVIDER_SITE_OTHER): Payer: No Typology Code available for payment source

## 2020-09-23 DIAGNOSIS — F332 Major depressive disorder, recurrent severe without psychotic features: Secondary | ICD-10-CM | POA: Diagnosis not present

## 2020-09-23 NOTE — Progress Notes (Signed)
Patient reported to [facility] for Repetitive Transcranial Magnetic Stimulation treatment for severe episode of recurrent major depressive disorder, without psychotic features. Patient presented with appropriate affect, level mood and denied any suicidal or homicidal ideations. Patient denies any other current symptoms and remains optimistic with continued TMS treatment. Patient reported no change in alcohol/substance use, caffeine consumption, sleep pattern or metal implant status since previous tx. Power was titrated to120% for the duration of tx and will remain there until patient gets adjusted. Patient reported no complaints or discomfort. Patient departed post-treatment with no concerns or complaints. 

## 2020-09-24 ENCOUNTER — Other Ambulatory Visit: Payer: Self-pay

## 2020-09-24 ENCOUNTER — Other Ambulatory Visit (INDEPENDENT_AMBULATORY_CARE_PROVIDER_SITE_OTHER): Payer: No Typology Code available for payment source

## 2020-09-24 DIAGNOSIS — F332 Major depressive disorder, recurrent severe without psychotic features: Secondary | ICD-10-CM | POA: Diagnosis not present

## 2020-09-24 NOTE — Progress Notes (Signed)
Patient reported to [facility] for Repetitive Transcranial Magnetic Stimulation treatment for severe episode of recurrent major depressive disorder, without psychotic features. Patient presented with appropriate affect, level mood and denied any suicidal or homicidal ideations. Patient denies any other current symptoms and remains optimistic with continued TMS treatment. Patient reported no change in alcohol/substance use, caffeine consumption, sleep pattern or metal implant status since previous tx. Power was titrated to120% for the duration of tx and will remain there until patient gets adjusted. Patient reported no complaints or discomfort. Patient departed post-treatment with no concerns or complaints. 

## 2020-09-25 ENCOUNTER — Other Ambulatory Visit: Payer: Self-pay

## 2020-09-25 ENCOUNTER — Other Ambulatory Visit (INDEPENDENT_AMBULATORY_CARE_PROVIDER_SITE_OTHER): Payer: No Typology Code available for payment source

## 2020-09-25 DIAGNOSIS — F332 Major depressive disorder, recurrent severe without psychotic features: Secondary | ICD-10-CM | POA: Diagnosis not present

## 2020-09-25 NOTE — Progress Notes (Signed)
Patient reported to [facility] for Repetitive Transcranial Magnetic Stimulation treatment for severe episode of recurrent major depressive disorder, without psychotic features. Patient presented with appropriate affect, level mood and denied any suicidal or homicidal ideations. Patient denies any other current symptoms and remains optimistic with continued TMS treatment. Patient reported no change in alcohol/substance use, caffeine consumption, sleep pattern or metal implant status since previous tx. Power was titrated to120% for the duration of tx and will remain there until patient gets adjusted. Patient reported no complaints or discomfort. Patient departed post-treatment with no concerns or complaints. 

## 2020-09-26 ENCOUNTER — Other Ambulatory Visit: Payer: Self-pay

## 2020-09-26 ENCOUNTER — Other Ambulatory Visit (INDEPENDENT_AMBULATORY_CARE_PROVIDER_SITE_OTHER): Payer: No Typology Code available for payment source

## 2020-09-26 DIAGNOSIS — F332 Major depressive disorder, recurrent severe without psychotic features: Secondary | ICD-10-CM

## 2020-09-26 NOTE — Progress Notes (Signed)
Patient reported to [facility] for Repetitive Transcranial Magnetic Stimulation treatment for severe episode of recurrent major depressive disorder, without psychotic features. Pt completed a PHQ-9 with a score of 6. Patient presented with appropriate affect, level mood and denied any suicidal or homicidal ideations. Patient denies any other current symptoms and remains optimistic with continued TMS treatment. Patient reported no change in alcohol/substance use, caffeine consumption, sleep pattern or metal implant status since previous tx. Power was titrated to 120% for the duration of tx and will remain there until patient gets adjusted. Patient reported no complaints or discomfort. Patient departed post-treatment with no concerns or complaints.

## 2020-09-27 ENCOUNTER — Other Ambulatory Visit (HOSPITAL_COMMUNITY): Payer: Self-pay | Admitting: Family Medicine

## 2020-09-27 MED FILL — metroNIDAZOLE 0.75 % GEL: 0.75 | 5 days supply | Qty: 70 | Fill #0

## 2020-09-27 MED FILL — FOLIC ACID 1 MG TABS: 1 | 90 days supply | Qty: 90 | Fill #0

## 2020-10-01 ENCOUNTER — Ambulatory Visit (INDEPENDENT_AMBULATORY_CARE_PROVIDER_SITE_OTHER): Payer: No Typology Code available for payment source

## 2020-10-01 DIAGNOSIS — J309 Allergic rhinitis, unspecified: Secondary | ICD-10-CM | POA: Diagnosis not present

## 2020-10-02 ENCOUNTER — Other Ambulatory Visit: Payer: Self-pay

## 2020-10-02 ENCOUNTER — Other Ambulatory Visit (HOSPITAL_COMMUNITY): Payer: No Typology Code available for payment source | Attending: Psychiatry

## 2020-10-02 DIAGNOSIS — F332 Major depressive disorder, recurrent severe without psychotic features: Secondary | ICD-10-CM | POA: Diagnosis not present

## 2020-10-02 NOTE — Progress Notes (Signed)
Patient reported to [facility] for Repetitive Transcranial Magnetic Stimulation treatment for severe episode of recurrent major depressive disorder, without psychotic features. Patient presented with appropriate affect, level mood and denied any suicidal or homicidal ideations. Patient denies any other current symptoms and remains optimistic with continued TMS treatment. Patient reported no change in alcohol/substance use, caffeine consumption, sleep pattern or metal implant status since previous tx. Power was titrated to120% for the duration of tx and will remain there until patient gets adjusted. Patient reported no complaints or discomfort. Patient departed post-treatment with no concerns or complaints. 

## 2020-10-04 ENCOUNTER — Other Ambulatory Visit (INDEPENDENT_AMBULATORY_CARE_PROVIDER_SITE_OTHER): Payer: No Typology Code available for payment source

## 2020-10-04 ENCOUNTER — Other Ambulatory Visit: Payer: Self-pay

## 2020-10-04 DIAGNOSIS — F332 Major depressive disorder, recurrent severe without psychotic features: Secondary | ICD-10-CM

## 2020-10-04 NOTE — Progress Notes (Signed)
Patient reported to [facility] for Repetitive Transcranial Magnetic Stimulation treatment for severe episode of recurrent major depressive disorder, without psychotic features. Patient presented with appropriate affect, level mood and denied any suicidal or homicidal ideations. Patient denies any other current symptoms and remains optimistic with continued TMS treatment. Patient reported no change in alcohol/substance use, caffeine consumption, sleep pattern or metal implant status since previous tx. Power was titrated to 120% for the duration of tx and will remain there until patient gets adjusted. Patient completed a PHQ-9 with a score of 11. Patient stated that she had a bad day yesterday.  Patient reported no complaints or discomfort. Patient departed post-treatment with no concerns or complaints.

## 2020-10-11 ENCOUNTER — Other Ambulatory Visit: Payer: Self-pay

## 2020-10-11 ENCOUNTER — Other Ambulatory Visit (INDEPENDENT_AMBULATORY_CARE_PROVIDER_SITE_OTHER): Payer: No Typology Code available for payment source

## 2020-10-11 DIAGNOSIS — F332 Major depressive disorder, recurrent severe without psychotic features: Secondary | ICD-10-CM

## 2020-10-11 NOTE — Progress Notes (Signed)
Patient reported to [facility] for Repetitive Transcranial Magnetic Stimulation treatment for severe episode of recurrent major depressive disorder, without psychotic features.Patient presented with appropriate affect, level mood and denied any suicidal or homicidal ideations. Patient denies any other current symptoms and remains optimistic with continued TMS treatment. Patient reported no change in alcohol/substance use, caffeine consumption, sleep pattern or metal implant status since previous tx. Power was titrated to 120% for the duration of tx and will remain there until patient gets adjusted. Patient completed a PHQ-9 with a score of3. Patient stated that she had a bad day yesterday.  Patient reported no complaints or discomfort. Patient departed post-treatment with no concerns or complaints.

## 2020-10-14 ENCOUNTER — Ambulatory Visit (INDEPENDENT_AMBULATORY_CARE_PROVIDER_SITE_OTHER): Payer: No Typology Code available for payment source

## 2020-10-14 DIAGNOSIS — J309 Allergic rhinitis, unspecified: Secondary | ICD-10-CM | POA: Diagnosis not present

## 2020-10-22 ENCOUNTER — Ambulatory Visit (INDEPENDENT_AMBULATORY_CARE_PROVIDER_SITE_OTHER): Payer: No Typology Code available for payment source

## 2020-10-22 DIAGNOSIS — J309 Allergic rhinitis, unspecified: Secondary | ICD-10-CM

## 2020-10-28 NOTE — Progress Notes (Signed)
Vial exp 10-30-21

## 2020-10-30 ENCOUNTER — Encounter: Payer: Self-pay | Admitting: Addiction (Substance Use Disorder)

## 2020-10-30 DIAGNOSIS — Z8049 Family history of malignant neoplasm of other genital organs: Secondary | ICD-10-CM | POA: Insufficient documentation

## 2020-10-30 DIAGNOSIS — Z820 Family history of epilepsy and other diseases of the nervous system: Secondary | ICD-10-CM | POA: Insufficient documentation

## 2020-10-30 DIAGNOSIS — Z8342 Family history of familial hypercholesterolemia: Secondary | ICD-10-CM | POA: Insufficient documentation

## 2020-10-30 DIAGNOSIS — Z833 Family history of diabetes mellitus: Secondary | ICD-10-CM | POA: Insufficient documentation

## 2020-10-30 DIAGNOSIS — Z803 Family history of malignant neoplasm of breast: Secondary | ICD-10-CM | POA: Insufficient documentation

## 2020-10-31 DIAGNOSIS — J3081 Allergic rhinitis due to animal (cat) (dog) hair and dander: Secondary | ICD-10-CM | POA: Diagnosis not present

## 2020-11-04 ENCOUNTER — Other Ambulatory Visit: Payer: Self-pay

## 2020-11-04 ENCOUNTER — Ambulatory Visit (INDEPENDENT_AMBULATORY_CARE_PROVIDER_SITE_OTHER): Payer: No Typology Code available for payment source | Admitting: Psychiatry

## 2020-11-04 DIAGNOSIS — F411 Generalized anxiety disorder: Secondary | ICD-10-CM | POA: Diagnosis not present

## 2020-11-04 DIAGNOSIS — F3342 Major depressive disorder, recurrent, in full remission: Secondary | ICD-10-CM | POA: Diagnosis not present

## 2020-11-04 NOTE — Progress Notes (Signed)
[facility] MD Progress Note  11/04/2020 12:31 PM Patricia Harvey  MRN:  [id number] Subjective: I am doing much better with my depression, PMS has helped me greatly Principal Problem: Diagnosis: Major depressive disorder, recurrent, in early remission Generalized anxiety disorder Total Time spent with patient: 20 minutes History of present illness:  Patient is a 29 year old female who presented for a follow-up visit after completing TMS for the second time.  Patient reports that she is doing much better in regards to her depression and on the PHQ 2, patient scores a 1.  Patient denies any feelings of hopelessness, worthlessness or guilt, reports that she is eating fine, sleeping well, adds that she is interactive and that her mood is good.  Patient also reports that her anxiety is doing better.  Patient states that she continues to follow-up with her [facility] and is taking her medications as prescribed by her outpatient provider which includes Lamictal, BuSpar.  Patient denies any symptoms of mania, psychosis, any thoughts of self-harm or harm to others.  She also denies any side effects with the medications.  Patient currently denies any aggravating factors and reports that the TMS treatment has helped alleviate her depression and that has been a relieving factor in regards to her depression and anxiety. Past Psychiatric History: Unchanged from previous visit, patient has history of anxiety and depression, recently received TMS treatment.  [facility] for her medication management for depression  Past Medical History:  Past Medical History:  Diagnosis Date  . Environmental allergies   . Eosinophilic esophagitis   . Eosinophilic esophagitis 04/07/2016   Formatting of this note might be different from the original. STs on 04/06/16 were negative.  Status Updated 2021- Negative test results for EOE from endoscopy  . IBS (irritable bowel  syndrome)     Past Surgical History:  Procedure Laterality Date  . COLONOSCOPY    . TONSILLECTOMY  2004  . UPPER GI ENDOSCOPY    . UPPER GI ENDOSCOPY     Checked for EOE status. Labs returned saying EOE was negative.   . WISDOM TOOTH EXTRACTION     Family History:  Family History  Problem Relation Age of Onset  . Allergic rhinitis Mother   . Allergic rhinitis Paternal Grandmother   . Allergic rhinitis Paternal Grandfather   . Eczema Neg Hx   . Urticaria Neg Hx   . Asthma Neg Hx   . Angioedema Neg Hx    Family Psychiatric  History: Unchanged from previous visit Social History:  Social History   Substance and Sexual Activity  Alcohol Use No     Social History   Substance and Sexual Activity  Drug Use No    Social History   Socioeconomic History  . Marital status: Married    Spouse name: Not on file  . Number of children: Not on file  . Years of education: Not on file  . Highest education level: Not on file  Occupational History  . Not on file  Tobacco Use  . Smoking status: Never Smoker  . Smokeless tobacco: Never Used  Vaping Use  . Vaping Use: Never used  Substance and Sexual Activity  . Alcohol use: No  . Drug use: No  . Sexual activity: Yes    Partners: Male    Comment: Married  Other Topics Concern  . Not on file  Social History Narrative   Right handed   Two story, has steps  No Caffeine    Social Determinants of Health   Financial Resource Strain:   . Difficulty of Paying Living Expenses: Not on file  Food Insecurity:   . Worried About Programme researcher, broadcasting/film/video in the Last Year: Not on file  . Ran Out of Food in the Last Year: Not on file  Transportation Needs:   . Lack of Transportation (Medical): Not on file  . Lack of Transportation (Non-Medical): Not on file  Physical Activity:   . Days of Exercise per Week: Not on file  . Minutes of Exercise per Session: Not on file  Stress:   . Feeling of Stress : Not on file  Social Connections:   .  Frequency of Communication with Friends and Family: Not on file  . Frequency of Social Gatherings with Friends and Family: Not on file  . Attends Religious Services: Not on file  . Active Member of Clubs or Organizations: Not on file  . Attends Banker Meetings: Not on file  . Marital Status: Not on file   Additional Social History:                         Sleep: Good  Appetite:  Good  Current Medications: Current Outpatient Medications  Medication Sig Dispense Refill  . albuterol (VENTOLIN HFA) 108 (90 Base) MCG/ACT inhaler Inhale 2 puffs into the lungs every 4 (four) hours as needed for wheezing or shortness of breath. 18 g 2  . Armodafinil 150 MG tablet  (Patient not taking: Reported on 06/25/2020)    . Armodafinil 50 MG tablet Take 100 mg by mouth daily. Take two 50 mg tablets twice a day    . azelastine (ASTELIN) 0.1 % nasal spray Place into the nose.    . Bromelains 500 MG TABS Take by mouth.    . busPIRone (BUSPAR) 5 MG tablet Take 5 mg by mouth daily. Takes 5mg  1-2 times daily    . cyclobenzaprine (FLEXERIL) 10 MG tablet Take 10 mg by mouth as needed.     . fluticasone (FLONASE) 50 MCG/ACT nasal spray 2 sprays by Each Nare route daily.    lamoTRIgine (LAMICTAL) 100 MG tablet Take 100 mg by mouth daily. Take one tablet daily    . lamoTRIgine (LAMICTAL) 25 MG tablet Take 25 mg by mouth every morning. Take one tablet daily    . levocetirizine (XYZAL) 5 MG tablet Take 5 mg by mouth every evening.    . meloxicam (MOBIC) 15 MG tablet Take 15 mg by mouth as needed.     . propranolol (INDERAL) 10 MG tablet Take 10 mg by mouth as needed.  (Patient not taking: Reported on 06/25/2020)    . TURMERIC PO Take 1 tablet by mouth daily.     No current facility-administered medications for this visit.    Lab Results: No results found for this or any previous visit (from the past 48 hour(s)).  Blood Alcohol level:  No results found for: [facility]  Metabolic Disorder  Labs: No results found for: HGBA1C, MPG No results found for: PROLACTIN No results found for: CHOL, TRIG, HDL, CHOLHDL, VLDL, LDLCALC  Physical Findings: AIMS:  , ,  ,  ,    CIWA:    COWS:     Musculoskeletal: Strength & Muscle Tone: within normal limits Gait & Station: normal Patient leans: N/A  Psychiatric Specialty Exam: Physical Exam  Review of Systems  Constitutional: Negative.  Negative for activity  change and fatigue.  HENT: Negative.  Negative for congestion, ear discharge, hearing loss, sinus pressure, sinus pain and sore throat.   Eyes: Negative.  Negative for discharge, itching and visual disturbance.  Respiratory: Negative.  Negative for cough, shortness of breath and wheezing.   Musculoskeletal: Negative.  Negative for arthralgias, back pain, gait problem, joint swelling and myalgias.  Neurological: Negative.  Negative for dizziness, tremors, seizures, syncope, facial asymmetry, speech difficulty, weakness, light-headedness, numbness and headaches.  Psychiatric/Behavioral: Negative.  Negative for agitation, behavioral problems, confusion, decreased concentration, dysphoric mood and hallucinations. The patient is not nervous/anxious and is not hyperactive.     There were no vitals taken for this visit.There is no height or weight on file to calculate BMI.  General Appearance: Casual  Eye Contact:  Good  Speech:  Clear and Coherent and Normal Rate  Volume:  Normal  Mood:  Euthymic  Affect:  Congruent and Full Range  Thought Process:  Coherent, Goal Directed and Descriptions of Associations: Intact  Orientation:  Full (Time, Place, and Person)  Thought Content:  WDL  Suicidal Thoughts:  No  Homicidal Thoughts:  No  Memory:  Immediate;   Fair Recent;   Fair Remote;   Fair  Judgement:  Intact  Insight:  Present  Psychomotor Activity:  Normal  Concentration:  Concentration: Good and Attention Span: Good  Recall:  Good  Fund of Knowledge:  Good  Language:  Fair   Akathisia:  No  Handed:  Right  AIMS (if indicated):     Assets:  Communication Skills Desire for Improvement Financial Resources/Insurance Housing Intimacy Leisure Time Physical Health Social Support Transportation  ADL's:  Intact  Cognition:  WNL  Sleep:        Treatment Plan Summary: Patient has done well with TMS, is stable in regards to her depression and anxiety.  Patient to continue to follow at the [facility] for her medication management which includes Lamictal, modafinil and BuSpar.  Patient sees Starling Manns, [place] at that practice and will continue to follow-up there.  Also discussed in length with patient like therapy as patient reports that she does struggle with changes in her mood during winter months.  Discussed coping skills, the need for continued therapy along with medication management to keep patient stable as she is currently doing well now.  Nelly Rout, MD 11/04/2020, 12:31 PM

## 2020-11-05 ENCOUNTER — Encounter (HOSPITAL_COMMUNITY): Payer: Self-pay | Admitting: Psychiatry

## 2020-11-06 ENCOUNTER — Ambulatory Visit (INDEPENDENT_AMBULATORY_CARE_PROVIDER_SITE_OTHER): Payer: No Typology Code available for payment source | Admitting: *Deleted

## 2020-11-06 DIAGNOSIS — J309 Allergic rhinitis, unspecified: Secondary | ICD-10-CM

## 2020-11-20 MED FILL — busPIRone HCL 10 MG TABS: 10 | 30 days supply | Qty: 90 | Fill #0

## 2020-11-27 ENCOUNTER — Ambulatory Visit (INDEPENDENT_AMBULATORY_CARE_PROVIDER_SITE_OTHER): Payer: No Typology Code available for payment source

## 2020-11-27 DIAGNOSIS — J309 Allergic rhinitis, unspecified: Secondary | ICD-10-CM | POA: Diagnosis not present

## 2020-12-03 ENCOUNTER — Ambulatory Visit (INDEPENDENT_AMBULATORY_CARE_PROVIDER_SITE_OTHER): Payer: No Typology Code available for payment source

## 2020-12-03 DIAGNOSIS — J309 Allergic rhinitis, unspecified: Secondary | ICD-10-CM | POA: Diagnosis not present

## 2020-12-05 ENCOUNTER — Encounter: Payer: Self-pay | Admitting: Addiction (Substance Use Disorder)

## 2020-12-09 ENCOUNTER — Ambulatory Visit (INDEPENDENT_AMBULATORY_CARE_PROVIDER_SITE_OTHER): Payer: No Typology Code available for payment source

## 2020-12-09 DIAGNOSIS — J309 Allergic rhinitis, unspecified: Secondary | ICD-10-CM | POA: Diagnosis not present

## 2020-12-17 ENCOUNTER — Ambulatory Visit (INDEPENDENT_AMBULATORY_CARE_PROVIDER_SITE_OTHER): Payer: No Typology Code available for payment source

## 2020-12-17 DIAGNOSIS — J309 Allergic rhinitis, unspecified: Secondary | ICD-10-CM

## 2020-12-18 ENCOUNTER — Telehealth: Payer: Self-pay

## 2020-12-18 ENCOUNTER — Other Ambulatory Visit (HOSPITAL_COMMUNITY): Payer: Self-pay | Admitting: Family Medicine

## 2020-12-18 MED ORDER — ALBUTEROL SULFATE HFA 108 (90 BASE) MCG/ACT IN AERS
2.0000 | INHALATION_SPRAY | RESPIRATORY_TRACT | 1 refills | Status: DC | PRN
Start: 2020-12-18 — End: 2020-12-18

## 2020-12-18 MED FILL — ALBUTEROL SULFATE HFA 108 (: 108 (90 BAS | 25 days supply | Qty: 18 | Fill #0 | Status: TO

## 2020-12-18 NOTE — Telephone Encounter (Signed)
Patient called and stated that she is having a flare up. Says that she is having breathing issues. Patient also stated that her spacer with the mask is broken and she can not locate her inhaler nor does she have a nebulizer. Patient stated that she began to experience a sore throat later last night about 11 pm after getting her allergy shot. Patient said she doesn't believe this has anything to do with her injection. Patient is currently out getting Covid tested. Patient also states that she has a telephone visit with her PCP this afternoon. Please advise on her treatment for her breathing issues.

## 2020-12-18 NOTE — Telephone Encounter (Signed)
Called and informed patient of Dr. Randell Patient note and patient expressed understanding. I informed her that she will need to pick up spacer in office today. Patient was concerned about cost and I informed her that her insurance will be billed and if anything was owed out of pocket her insurance will sent her a bill. Her albuterol has been sent to the pharmacy of her choice.

## 2020-12-18 NOTE — Telephone Encounter (Signed)
She had a similar issues in 2020 where she was having some respiratory issues where I did provide her with an albuterol inhaler for as needed use at that time.  That inhaler at this point is likely expired thus okay to refill albuterol inhaler.  Does she need a new spacer as well?  I agree I do not believe her sore throat has any correlation with her allergy injection. Would do agree with the COVID test to make sure that this is not COVID illness she is having.

## 2020-12-19 ENCOUNTER — Other Ambulatory Visit (HOSPITAL_COMMUNITY): Payer: Self-pay | Admitting: Family Medicine

## 2020-12-19 MED FILL — IPRAT-ALBUT 0.5-3(2.5) MG/3: 0.5-2.5 (3) | 8 days supply | Qty: 90 | Fill #0

## 2020-12-19 MED FILL — ALBUTEROL 0.083% INHAL SOLN: (2.5 MG/3ML | 4 days supply | Qty: 75 | Fill #0

## 2020-12-19 MED FILL — ALBUTEROL SULFATE HFA 108 (: 108 (90 BAS | 25 days supply | Qty: 18 | Fill #0

## 2020-12-27 ENCOUNTER — Other Ambulatory Visit: Payer: Self-pay

## 2020-12-27 MED ORDER — EPINEPHRINE 0.3 MG/0.3ML IJ SOAJ: 0.3000 mg | Freq: Once | INTRAMUSCULAR | 1 refills | Status: AC

## 2021-01-03 ENCOUNTER — Ambulatory Visit (INDEPENDENT_AMBULATORY_CARE_PROVIDER_SITE_OTHER): Payer: No Typology Code available for payment source | Admitting: *Deleted

## 2021-01-03 DIAGNOSIS — J309 Allergic rhinitis, unspecified: Secondary | ICD-10-CM

## 2021-01-06 ENCOUNTER — Ambulatory Visit (INDEPENDENT_AMBULATORY_CARE_PROVIDER_SITE_OTHER): Payer: No Typology Code available for payment source | Admitting: *Deleted

## 2021-01-06 DIAGNOSIS — J309 Allergic rhinitis, unspecified: Secondary | ICD-10-CM | POA: Diagnosis not present

## 2021-01-07 ENCOUNTER — Telehealth: Payer: Self-pay

## 2021-01-07 NOTE — Telephone Encounter (Signed)
Called and tried to leave a message for patient but her mailbox is full. Called to inform patient that [facility] pharmacy has been trying to reach her regarding her Auvi-Q. [facility] is requesting that patient call them back at [phone].

## 2021-01-10 ENCOUNTER — Other Ambulatory Visit (HOSPITAL_COMMUNITY): Payer: Self-pay | Admitting: Family Medicine

## 2021-01-22 ENCOUNTER — Telehealth: Payer: Self-pay

## 2021-01-22 ENCOUNTER — Other Ambulatory Visit: Payer: Self-pay | Admitting: Allergy

## 2021-01-22 ENCOUNTER — Ambulatory Visit (INDEPENDENT_AMBULATORY_CARE_PROVIDER_SITE_OTHER): Payer: No Typology Code available for payment source | Admitting: *Deleted

## 2021-01-22 ENCOUNTER — Telehealth: Payer: Self-pay | Admitting: *Deleted

## 2021-01-22 DIAGNOSIS — J309 Allergic rhinitis, unspecified: Secondary | ICD-10-CM

## 2021-01-22 MED ORDER — EPINEPHRINE 0.3 MG/0.3ML IJ SOAJ
0.3000 mg | Freq: Once | INTRAMUSCULAR | 2 refills | Status: DC
Start: 2021-01-22 — End: 2021-01-22

## 2021-01-22 MED FILL — EPINEPHRINE 0.3 MG AUTO-INJ: 0.3 | 30 days supply | Qty: 2 | Fill #0

## 2021-01-22 NOTE — Telephone Encounter (Signed)
Pt is curious about knowing more about sublingal therapy versus the allergy injections she is currently on. She wants to know if this is something that she would benefit from.

## 2021-01-22 NOTE — Telephone Encounter (Signed)
Called to notify patient that she should contact Auvi-[facility] to receive her prescription. Patient stated that her co-pay seemed to be too high and wanted to know what the average price would be. I notified her that prices range but I'd have information for her in the front if she wanted to pick them up in office. Patient agreed to do so. Patient also agreed to have a generic Epipen sent in to her pharmacy which has been completed as well.

## 2021-01-23 ENCOUNTER — Encounter: Payer: Self-pay | Admitting: *Deleted

## 2021-01-23 NOTE — Telephone Encounter (Signed)
Mychart message sent.

## 2021-01-23 NOTE — Telephone Encounter (Signed)
Please let patient know that there is only sublingual therapy for grass pollen, weed pollen and dust mites. She is being treated in her immunotherapy for tree pollen as well as animals which we do not have sublingual therapy for that she would have an incomplete treatment response with sublingual therapy only.

## 2021-02-07 ENCOUNTER — Encounter: Payer: Self-pay | Admitting: *Deleted

## 2021-02-10 ENCOUNTER — Ambulatory Visit (INDEPENDENT_AMBULATORY_CARE_PROVIDER_SITE_OTHER): Payer: No Typology Code available for payment source

## 2021-02-10 DIAGNOSIS — J309 Allergic rhinitis, unspecified: Secondary | ICD-10-CM | POA: Diagnosis not present

## 2021-03-04 ENCOUNTER — Ambulatory Visit (INDEPENDENT_AMBULATORY_CARE_PROVIDER_SITE_OTHER): Payer: No Typology Code available for payment source | Admitting: *Deleted

## 2021-03-04 DIAGNOSIS — J309 Allergic rhinitis, unspecified: Secondary | ICD-10-CM

## 2021-03-24 ENCOUNTER — Ambulatory Visit
Admission: RE | Admit: 2021-03-24 | Discharge: 2021-03-24 | Disposition: A | Discharge status: Home / Self Care | Payer: No Typology Code available for payment source | Source: Ambulatory Visit | Attending: Family Medicine | Admitting: Family Medicine

## 2021-03-24 ENCOUNTER — Other Ambulatory Visit: Payer: Self-pay | Admitting: Family Medicine

## 2021-03-24 ENCOUNTER — Other Ambulatory Visit: Payer: Self-pay

## 2021-03-24 ENCOUNTER — Ambulatory Visit (INDEPENDENT_AMBULATORY_CARE_PROVIDER_SITE_OTHER): Payer: No Typology Code available for payment source | Admitting: *Deleted

## 2021-03-24 DIAGNOSIS — M79644 Pain in right finger(s): Secondary | ICD-10-CM

## 2021-03-24 DIAGNOSIS — J309 Allergic rhinitis, unspecified: Secondary | ICD-10-CM | POA: Diagnosis not present

## 2021-03-24 DIAGNOSIS — M25531 Pain in right wrist: Secondary | ICD-10-CM

## 2021-04-01 DIAGNOSIS — J3081 Allergic rhinitis due to animal (cat) (dog) hair and dander: Secondary | ICD-10-CM

## 2021-04-01 NOTE — Progress Notes (Signed)
VIAL EXP 04-01-22 

## 2021-04-17 ENCOUNTER — Ambulatory Visit (INDEPENDENT_AMBULATORY_CARE_PROVIDER_SITE_OTHER): Payer: No Typology Code available for payment source

## 2021-04-17 DIAGNOSIS — J309 Allergic rhinitis, unspecified: Secondary | ICD-10-CM

## 2021-05-07 ENCOUNTER — Ambulatory Visit (INDEPENDENT_AMBULATORY_CARE_PROVIDER_SITE_OTHER): Payer: No Typology Code available for payment source

## 2021-05-07 DIAGNOSIS — J309 Allergic rhinitis, unspecified: Secondary | ICD-10-CM

## 2021-05-13 ENCOUNTER — Ambulatory Visit (INDEPENDENT_AMBULATORY_CARE_PROVIDER_SITE_OTHER): Payer: No Typology Code available for payment source | Admitting: *Deleted

## 2021-05-13 DIAGNOSIS — J309 Allergic rhinitis, unspecified: Secondary | ICD-10-CM | POA: Diagnosis not present

## 2021-05-19 ENCOUNTER — Telehealth: Payer: Self-pay | Admitting: Neurology

## 2021-05-19 NOTE — Telephone Encounter (Signed)
Is this okay to do?

## 2021-05-19 NOTE — Telephone Encounter (Signed)
Pls send to [facility] for vestibular therapy, pls put on note: for vertigo and gaze stabilization exercises. thanks

## 2021-05-20 ENCOUNTER — Other Ambulatory Visit: Payer: Self-pay

## 2021-05-20 ENCOUNTER — Ambulatory Visit (INDEPENDENT_AMBULATORY_CARE_PROVIDER_SITE_OTHER): Payer: No Typology Code available for payment source | Admitting: *Deleted

## 2021-05-20 DIAGNOSIS — J309 Allergic rhinitis, unspecified: Secondary | ICD-10-CM

## 2021-05-20 DIAGNOSIS — R42 Dizziness and giddiness: Secondary | ICD-10-CM

## 2021-05-20 NOTE — Telephone Encounter (Signed)
Order placed

## 2021-05-21 ENCOUNTER — Other Ambulatory Visit (HOSPITAL_COMMUNITY): Payer: Self-pay

## 2021-05-21 MED ORDER — BUSPIRONE HCL 10 MG PO TABS
ORAL_TABLET | ORAL | 1 refills | Status: DC
  Filled 2021-05-21: qty 270, 90d supply, fill #0

## 2021-05-27 ENCOUNTER — Ambulatory Visit (INDEPENDENT_AMBULATORY_CARE_PROVIDER_SITE_OTHER): Payer: No Typology Code available for payment source | Admitting: *Deleted

## 2021-05-27 DIAGNOSIS — J309 Allergic rhinitis, unspecified: Secondary | ICD-10-CM | POA: Diagnosis not present

## 2021-05-29 ENCOUNTER — Other Ambulatory Visit (HOSPITAL_COMMUNITY): Payer: Self-pay

## 2021-06-03 ENCOUNTER — Ambulatory Visit (INDEPENDENT_AMBULATORY_CARE_PROVIDER_SITE_OTHER): Payer: No Typology Code available for payment source | Admitting: *Deleted

## 2021-06-03 DIAGNOSIS — J309 Allergic rhinitis, unspecified: Secondary | ICD-10-CM

## 2021-06-06 ENCOUNTER — Other Ambulatory Visit (HOSPITAL_COMMUNITY): Payer: Self-pay

## 2021-06-06 MED ORDER — PAXLOVID 20 X 150 MG & 10 X 100MG PO TBPK
ORAL_TABLET | ORAL | 0 refills | Status: DC
  Filled 2021-06-06: qty 30, 5d supply, fill #0

## 2021-06-06 MED ORDER — ALBUTEROL SULFATE (2.5 MG/3ML) 0.083% IN NEBU
INHALATION_SOLUTION | RESPIRATORY_TRACT | 0 refills | Status: DC
  Filled 2021-06-06: qty 300, 17d supply, fill #0

## 2021-06-11 ENCOUNTER — Other Ambulatory Visit (HOSPITAL_COMMUNITY): Payer: Self-pay

## 2021-06-11 MED ORDER — METHYLPHENIDATE HCL 5 MG PO TABS
ORAL_TABLET | ORAL | 0 refills | Status: DC
  Filled 2021-06-11 – 2021-07-17 (×2): qty 30, 30d supply, fill #0

## 2021-06-12 ENCOUNTER — Ambulatory Visit (INDEPENDENT_AMBULATORY_CARE_PROVIDER_SITE_OTHER): Payer: No Typology Code available for payment source | Admitting: *Deleted

## 2021-06-12 DIAGNOSIS — J309 Allergic rhinitis, unspecified: Secondary | ICD-10-CM | POA: Diagnosis not present

## 2021-06-13 ENCOUNTER — Ambulatory Visit: Payer: No Typology Code available for payment source | Attending: Neurology

## 2021-06-13 ENCOUNTER — Other Ambulatory Visit: Payer: Self-pay

## 2021-06-13 DIAGNOSIS — R2689 Other abnormalities of gait and mobility: Secondary | ICD-10-CM | POA: Insufficient documentation

## 2021-06-13 DIAGNOSIS — R2681 Unsteadiness on feet: Secondary | ICD-10-CM | POA: Diagnosis present

## 2021-06-13 DIAGNOSIS — R42 Dizziness and giddiness: Secondary | ICD-10-CM | POA: Insufficient documentation
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
## 2021-06-19 ENCOUNTER — Other Ambulatory Visit (HOSPITAL_COMMUNITY): Payer: Self-pay

## 2021-07-01 ENCOUNTER — Ambulatory Visit (INDEPENDENT_AMBULATORY_CARE_PROVIDER_SITE_OTHER): Payer: No Typology Code available for payment source

## 2021-07-01 DIAGNOSIS — J309 Allergic rhinitis, unspecified: Secondary | ICD-10-CM

## 2021-07-03 ENCOUNTER — Other Ambulatory Visit: Payer: Self-pay

## 2021-07-03 ENCOUNTER — Ambulatory Visit: Payer: No Typology Code available for payment source | Attending: Neurology

## 2021-07-03 DIAGNOSIS — R2681 Unsteadiness on feet: Secondary | ICD-10-CM | POA: Insufficient documentation

## 2021-07-03 DIAGNOSIS — R2689 Other abnormalities of gait and mobility: Secondary | ICD-10-CM | POA: Insufficient documentation

## 2021-07-03 DIAGNOSIS — R42 Dizziness and giddiness: Secondary | ICD-10-CM | POA: Insufficient documentation

## 2021-07-03 NOTE — Patient Instructions (Signed)
Access Code: [email or web address] URL: [email or web address] Date: 07/03/2021 Prepared by: Jethro Bastos  Exercises Romberg Stance Eyes Closed on Foam Pad - 1 x daily - 7 x weekly - 1 sets - 3 reps - 30 seconds hold

## 2021-07-03 NOTE — Therapy (Signed)
[facility] [address] [place], [place], [address] Phone: [phone]   Fax:  [phone]  Physical Therapy Treatment  Patient Details  Name: Patricia Harvey MRN: [id number] Date of Birth: 07/01/1991 Referring Provider (PT): Patrcia Dolly, MD   Encounter Date: 07/03/2021   PT End of Session - 07/03/21 1708     Visit Number 2    Number of Visits 9    Date for PT Re-Evaluation 08/08/21    Authorization Type [employer] (12 Visits Approved)    PT Start Time 1705   patient arriving late   PT Stop Time 1747    PT Time Calculation (min) 42 min    Activity Tolerance Patient tolerated treatment well    Behavior During Therapy [facility] for tasks assessed/performed             Past Medical History:  Diagnosis Date   Environmental allergies    Eosinophilic esophagitis    Eosinophilic esophagitis 04/07/2016   Formatting of this note might be different from the original. STs on 04/06/16 were negative.  Status Updated 2021- Negative test results for EOE from endoscopy   IBS (irritable bowel syndrome)     Past Surgical History:  Procedure Laterality Date   BUNIONECTOMY Right 2014   COLONOSCOPY     TONSILLECTOMY  2004   UPPER GI ENDOSCOPY     UPPER GI ENDOSCOPY     Checked for EOE status. Labs returned saying EOE was negative.    WISDOM TOOTH EXTRACTION      There were no vitals filed for this visit.   Subjective Assessment - 07/03/21 1707     Subjective Patient denies new changes/complaints. continue to have sensation where she feels on a boat, still has the days where the sensation is strong. Today is a mild day.    Pertinent History IBS, eosinophilic esophagitis, idiopathic hypersomnia, ADHD    Patient Stated Goals Go on [address]; No Nauseous with Seated; Completed a light workout    Currently in Pain? No/denies               Neuro re-ed: sensory organization test performed with following results: Conditions: 1:  1st trial below; 2nd and 3rd trial above age related norms 2: 1st and 2nd below; 3rd trial above age related norms 3: all trials below age norms  4: 1st and 3rd trial above, 2nd trial below 5: 1st trial above, 2nd and 3rd below 6: all trials above age related  Composite score: 68% (below age norm) Sensory Analysis Som: below age norm (80%) Vis: above age norm Vest: above age norm Pref: 100% Strategy analysis: more use of hip strategy noted       COG alignment: slight posterior COG        PT educating on SOT results, and addressing patient's questions. PT providing extensive education on balance systems and balance strategies and how PT will target these impairments.   Rest of session established initial HEP, see below. Increased postural sway noted with vision removed.   Access Code: [email or web address] URL: [email or web address] Date: 07/03/2021 Prepared by: Jethro Bastos  Exercises Romberg Stance Eyes Closed on Foam Pad - 1 x daily - 7 x weekly - 1 sets - 3 reps - 30 seconds hold    [facility] Adult PT Treatment/Exercise - 07/03/21 0001       Ambulation/Gait   Ambulation/Gait Yes    Ambulation/Gait Assistance 7: Independent    Ambulation Distance (Feet) --   clinic distance  Assistive device None    Gait Pattern Within Functional Limits    Ambulation Surface Level;Indoor                    PT Education - 07/03/21 1749     Education Details SOT Results; Hip/Ankle Strategy; Initial HEP    Person(s) Educated Patient    Methods Explanation;Demonstration;Handout    Comprehension Returned demonstration;Verbalized understanding              PT Short Term Goals - 06/13/21 1621       PT SHORT TERM GOAL #1   Title Patient will be independent with initial Vestibular/Balance HEP (All STGS: 07/11/21)    Baseline no HEP established    Time 4    Period Weeks    Status New    Target Date 07/11/21      PT SHORT TERM GOAL #2   Title Further Balance  Assesment to be compelted and LTG to be set for FGA/SOT    Baseline TBA    Time 4    Period Weeks    Status New               PT Long Term Goals - 07/03/21 2053       PT LONG TERM GOAL #1   Title Patient will be independent with Final Vestibular/Balance HEP (All LTGs Due: 08/08/21)    Baseline no HEP established    Time 8    Period Weeks    Status New      PT LONG TERM GOAL #2   Title Patient will improve DPS >/= 50, and DFS >/= 45    Baseline DPS: 43, DFS: 38.9    Time 8    Period Weeks    Status New      PT LONG TERM GOAL #3   Title Patient will improve Composite Score on SOT to >/= 80% to demonstrate improved balance    Baseline 68%    Time 8    Period Weeks    Status Revised      PT LONG TERM GOAL #4   Title LTG to be set for FGA    Baseline TBA    Time 8    Period Weeks    Status New      PT LONG TERM GOAL #5   Title Patient will report ability to return to gym activities with no dizziness and no imbalance    Baseline imbalance reported    Time 8    Period Weeks    Status New                   Plan - 07/03/21 2054     Clinical Impression Statement Completed Sensory Organization Test for further assesment of balance with patient demonstrating somatosensory component below age related norms, as well as overall composite score of 68%. Patient demonstrating impaired use of balance strategies, especially noted with ankle during testing. PT educated on resulst and initiated balance HEP. Will continue to progress toward all LTGs.    Personal Factors and Comorbidities Comorbidity 2;Time since onset of injury/illness/exacerbation    Comorbidities IBS, eosinophilic esophagitis, idiopathic hypersomnia, ADHD    Examination-Activity Limitations Stand;Reach Overhead;Bend    Examination-Participation Restrictions Driving;Community Activity;Yard Work;Cleaning;Occupation    Stability/Clinical Decision Making Stable/Uncomplicated    Rehab Potential Good    PT  Frequency 1x / week    PT Duration 8 weeks    PT Treatment/Interventions ADLs/Self Care Home Management;Canalith  Repostioning;Cryotherapy;Electrical Stimulation;Moist Heat;Gait training;Stair training;Functional mobility training;Therapeutic activities;Therapeutic exercise;Balance training;Neuromuscular re-education;Patient/family education;Manual techniques;Passive range of motion;Dry needling;Vestibular;Spinal Manipulations;Joint Manipulations    PT Next Visit Plan Assess FGA and update goals.Review Balance HEP (add to it, weight shift/rockerboard) and VOR x 1    Consulted and Agree with Plan of Care Patient             Patient will benefit from skilled therapeutic intervention in order to improve the following deficits and impairments:  Decreased balance, Decreased activity tolerance, Dizziness, Decreased range of motion, Difficulty walking, Pain  Visit Diagnosis: Unsteadiness on feet  Other abnormalities of gait and mobility  Dizziness and giddiness     Problem List Patient Active Problem List   Diagnosis Date Noted   Family history of breast cancer in mother 10/30/2020   Family history of uterine cancer 10/30/2020   Family history of cervical cancer 10/30/2020   Family history of amyotrophic lateral sclerosis 10/30/2020   Family history of high cholesterol 10/30/2020   Family history of type II diabetes mellitus 10/30/2020   Family history of Parkinson's disease 10/30/2020   MDD (major depressive disorder), recurrent severe, 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   Pre-nodular edema of the vocal folds 06/09/2019   Anxiety 05/04/2019   At high risk for breast cancer 05/04/2019   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   Posttraumatic stress disorder 05/04/2019   Vertigo 05/04/2019   Cervical radiculopathy 03/30/2019   Scoliosis deformity of spine 08/04/2018   Degeneration of lumbar intervertebral disc 08/04/2018   Allergic rhinitis 04/07/2016    Tempie Donning, PT,  DPT 07/03/2021, 8:58 PM  [place] [facility] [address] [place], [place], [address] Phone: [phone]   Fax:  [phone]  Name: Patricia Harvey MRN: [id number] Date of Birth: 09-12-1991

## 2021-07-10 ENCOUNTER — Other Ambulatory Visit: Payer: Self-pay

## 2021-07-10 ENCOUNTER — Ambulatory Visit: Payer: No Typology Code available for payment source

## 2021-07-10 DIAGNOSIS — R2689 Other abnormalities of gait and mobility: Secondary | ICD-10-CM

## 2021-07-10 DIAGNOSIS — R42 Dizziness and giddiness: Secondary | ICD-10-CM

## 2021-07-10 DIAGNOSIS — R2681 Unsteadiness on feet: Secondary | ICD-10-CM

## 2021-07-10 NOTE — Therapy (Signed)
[facility] [address] [place], [place], [address] Phone: [phone]   Fax:  [phone]  Physical Therapy Treatment  Patient Details  Name: Patricia Harvey MRN: [id number] Date of Birth: 19-May-1991 Referring Provider (PT): Patrcia Dolly, MD   Encounter Date: 07/10/2021   PT End of Session - 07/10/21 1709     Visit Number 3    Number of Visits 9    Date for PT Re-Evaluation 08/08/21    Authorization Type [employer] (12 Visits Approved)    PT Start Time 1707   patient arriving late   PT Stop Time 1745    PT Time Calculation (min) 38 min    Activity Tolerance Patient tolerated treatment well    Behavior During Therapy [facility] for tasks assessed/performed             Past Medical History:  Diagnosis Date   Environmental allergies    Eosinophilic esophagitis    Eosinophilic esophagitis 04/07/2016   Formatting of this note might be different from the original. STs on 04/06/16 were negative.  Status Updated 2021- Negative test results for EOE from endoscopy   IBS (irritable bowel syndrome)     Past Surgical History:  Procedure Laterality Date   BUNIONECTOMY Right 2014   COLONOSCOPY     TONSILLECTOMY  2004   UPPER GI ENDOSCOPY     UPPER GI ENDOSCOPY     Checked for EOE status. Labs returned saying EOE was negative.    WISDOM TOOTH EXTRACTION      There were no vitals filed for this visit.   Subjective Assessment - 07/10/21 1709     Subjective Patient report no new changes/complaints.    Pertinent History IBS, eosinophilic esophagitis, idiopathic hypersomnia, ADHD    Patient Stated Goals Go on [address]; No Nauseous with Seated; Completed a light workout    Currently in Pain? No/denies                [facility] PT Assessment - 07/10/21 0001       Functional Gait  Assessment   Gait assessed  Yes    Gait Level Surface Walks 20 ft in less than 5.5 sec, no assistive devices, good speed, no evidence for imbalance,  normal gait pattern, deviates no more than 6 in outside of the 12 in walkway width.    Change in Gait Speed Able to smoothly change walking speed without loss of balance or gait deviation. Deviate no more than 6 in outside of the 12 in walkway width.    Gait with Horizontal Head Turns Performs head turns smoothly with slight change in gait velocity (eg, minor disruption to smooth gait path), deviates 6-10 in outside 12 in walkway width, or uses an assistive device.    Gait with Vertical Head Turns Performs task with slight change in gait velocity (eg, minor disruption to smooth gait path), deviates 6 - 10 in outside 12 in walkway width or uses assistive device    Gait and Pivot Turn Pivot turns safely within 3 sec and stops quickly with no loss of balance.   mild dizziness; "takes time for world to catch up"   Step Over Obstacle Is able to step over 2 stacked shoe boxes taped together (9 in total height) without changing gait speed. No evidence of imbalance.    Gait with Narrow Base of Support Is able to ambulate for 10 steps heel to toe with no staggering.    Gait with Eyes Closed  Walks 20 ft, uses assistive device, slower speed, mild gait deviations, deviates 6-10 in outside 12 in walkway width. Ambulates 20 ft in less than 9 sec but greater than 7 sec.    Ambulating Backwards Walks 20 ft, no assistive devices, good speed, no evidence for imbalance, normal gait    Steps Alternating feet, no rail.    Total Score 27    FGA comment: 27/30               [facility] Adult PT Treatment/Exercise - 07/10/21 0001       Therapeutic Activites    Therapeutic Activities Other Therapeutic Activities    Other Therapeutic Activities standing on firm surface feet apart completed visual tracking with ball x 10 reps CW, mild gaze evoked nystagmus noted with R Upward Gaze with associated Dizziness      Neuro Re-ed    Neuro Re-ed Details  Standing on inverted BOSU completed horizontal/vertical head turns  x 10  reps each direction. increased challenge with vertical > horizontal. Completed standing A/P weight shift x 10 reps, increased cues to weight shift through ankle vs using hip strategy.             Vestibular Treatment/Exercise - 07/10/21 0001       Vestibular Treatment/Exercise   Vestibular Treatment Provided Gaze    Gaze Exercises X1 Viewing Horizontal;X1 Viewing Vertical      X1 Viewing Horizontal   Foot Position standing feet apart    Reps 2    Comments x 30 seconds; x 15 seconds      X1 Viewing Vertical   Foot Position standing feet apart    Reps 2    Comments x 15 seconds; x 30 seconds            Gaze Stabilization: Standing Feet Apart    Feet shoulder width apart, keeping eyes on target on wall 3-4 feet away, tilt head down 15-30 and move head side to side for 15-30  seconds. Repeat while moving head up and down for 15-30 seconds. Do 2-3 sessions per day.  Copyright  VHI. All rights reserved.      PT Short Term Goals - 07/10/21 1756       PT SHORT TERM GOAL #1   Title Patient will be independent with initial Vestibular/Balance HEP (All STGS: 07/11/21)    Baseline no HEP established; reports independence    Time 4    Period Weeks    Status Achieved    Target Date 07/11/21      PT SHORT TERM GOAL #2   Title Further Balance Assesment to be compelted and LTG to be set for FGA/SOT    Baseline LTG set    Time 4    Period Weeks    Status Achieved               PT Long Term Goals - 07/10/21 1755       PT LONG TERM GOAL #1   Title Patient will be independent with Final Vestibular/Balance HEP (All LTGs Due: 08/08/21)    Baseline no HEP established    Time 8    Period Weeks    Status New      PT LONG TERM GOAL #2   Title Patient will improve DPS >/= 50, and DFS >/= 45    Baseline DPS: 43, DFS: 38.9    Time 8    Period Weeks    Status New      PT LONG  TERM GOAL #3   Title Patient will improve Composite Score on SOT to >/= 80% to demonstrate  improved balance    Baseline 68%    Time 8    Period Weeks    Status Revised      PT LONG TERM GOAL #4   Title Patient will improve FGA to >/= 29/30    Baseline 27/30    Time 8    Period Weeks    Status Revised      PT LONG TERM GOAL #5   Title Patient will report ability to return to gym activities with no dizziness and no imbalance    Baseline imbalance reported    Time 8    Period Weeks    Status New                   Plan - 07/10/21 1756     Clinical Impression Statement Assessed FGA with patient scoring 27/30 today, only reports dizziness with 180 deg turn and head nods. No significant imbalance noted. Continued balance exercises on inverted BOSU with patient tolerating wlel. patient did demo mild gaze evoked nystagmus with R Gaze during visual tracking. INitiated VOR x 1 with moderate dizziness. Will continue to progress toward all goals.    Personal Factors and Comorbidities Comorbidity 2;Time since onset of injury/illness/exacerbation    Comorbidities IBS, eosinophilic esophagitis, idiopathic hypersomnia, ADHD    Examination-Activity Limitations Stand;Reach Overhead;Bend    Examination-Participation Restrictions Driving;Community Activity;Yard Work;Cleaning;Occupation    Stability/Clinical Decision Making Stable/Uncomplicated    Rehab Potential Good    PT Frequency 1x / week    PT Duration 8 weeks    PT Treatment/Interventions ADLs/Self Care Home Management;Canalith Repostioning;Cryotherapy;Electrical Stimulation;Moist Heat;Gait training;Stair training;Functional mobility training;Therapeutic activities;Therapeutic exercise;Balance training;Neuromuscular re-education;Patient/family education;Manual techniques;Passive range of motion;Dry needling;Vestibular;Spinal Manipulations;Joint Manipulations    PT Next Visit Plan Continue visual tracking/saccade/smooth pursuit training. Progress VOR. Continue High Level  Balance    Consulted and Agree with Plan of Care Patient              Patient will benefit from skilled therapeutic intervention in order to improve the following deficits and impairments:  Decreased balance, Decreased activity tolerance, Dizziness, Decreased range of motion, Difficulty walking, Pain  Visit Diagnosis: Unsteadiness on feet  Other abnormalities of gait and mobility  Dizziness and giddiness     Problem List Patient Active Problem List   Diagnosis Date Noted   Family history of breast cancer in mother 10/30/2020   Family history of uterine cancer 10/30/2020   Family history of cervical cancer 10/30/2020   Family history of amyotrophic lateral sclerosis 10/30/2020   Family history of high cholesterol 10/30/2020   Family history of type II diabetes mellitus 10/30/2020   Family history of Parkinson's disease 10/30/2020   MDD (major depressive disorder), recurrent severe, 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   Pre-nodular edema of the vocal folds 06/09/2019   Anxiety 05/04/2019   At high risk for breast cancer 05/04/2019   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   Posttraumatic stress disorder 05/04/2019   Vertigo 05/04/2019   Cervical radiculopathy 03/30/2019   Scoliosis deformity of spine 08/04/2018   Degeneration of lumbar intervertebral disc 08/04/2018   Allergic rhinitis 04/07/2016    Tempie Donning, PT, DPT 07/10/2021, 5:59 PM  [facility] [address] [place], [place], [address] Phone: [phone]   Fax:  [phone]  Name: Patricia Harvey MRN: [id number] Date of Birth: 18-Apr-1991

## 2021-07-10 NOTE — Patient Instructions (Signed)
Gaze Stabilization: Standing Feet Apart    Feet shoulder width apart, keeping eyes on target on wall 3-4 feet away, tilt head down 15-30 and move head side to side for 15-30  seconds. Repeat while moving head up and down for 15-30 seconds. Do 2-3 sessions per day.  Copyright  VHI. All rights reserved.

## 2021-07-14 ENCOUNTER — Ambulatory Visit: Payer: No Typology Code available for payment source

## 2021-07-17 ENCOUNTER — Other Ambulatory Visit (HOSPITAL_COMMUNITY): Payer: Self-pay

## 2021-07-17 ENCOUNTER — Other Ambulatory Visit: Payer: Self-pay | Admitting: Obstetrics & Gynecology

## 2021-07-17 DIAGNOSIS — Z803 Family history of malignant neoplasm of breast: Secondary | ICD-10-CM

## 2021-07-22 ENCOUNTER — Ambulatory Visit (INDEPENDENT_AMBULATORY_CARE_PROVIDER_SITE_OTHER): Payer: No Typology Code available for payment source | Admitting: *Deleted

## 2021-07-22 DIAGNOSIS — J309 Allergic rhinitis, unspecified: Secondary | ICD-10-CM

## 2021-07-23 ENCOUNTER — Other Ambulatory Visit (HOSPITAL_COMMUNITY): Payer: Self-pay

## 2021-07-23 MED ORDER — DIAZEPAM 5 MG PO TABS
ORAL_TABLET | ORAL | 0 refills | Status: DC
  Filled 2021-07-23: qty 3, 3d supply, fill #0

## 2021-07-24 ENCOUNTER — Ambulatory Visit: Payer: No Typology Code available for payment source

## 2021-07-24 ENCOUNTER — Other Ambulatory Visit: Payer: Self-pay

## 2021-07-24 DIAGNOSIS — R42 Dizziness and giddiness: Secondary | ICD-10-CM

## 2021-07-24 DIAGNOSIS — R2689 Other abnormalities of gait and mobility: Secondary | ICD-10-CM

## 2021-07-24 DIAGNOSIS — R2681 Unsteadiness on feet: Secondary | ICD-10-CM | POA: Diagnosis not present

## 2021-07-24 NOTE — Therapy (Signed)
[facility] [address] [place], [place], [address] Phone: [phone]   Fax:  [phone]  Physical Therapy Treatment  Patient Details  Name: Patricia Harvey MRN: [id number] Date of Birth: 1991-04-12 Referring Provider (PT): Patrcia Dolly, MD   Encounter Date: 07/24/2021   PT End of Session - 07/24/21 1709     Visit Number 4    Number of Visits 9    Date for PT Re-Evaluation 08/08/21    Authorization Type [employer] (12 Visits Approved)    PT Start Time 1702    PT Stop Time 1748    PT Time Calculation (min) 46 min    Activity Tolerance Patient tolerated treatment well    Behavior During Therapy [facility] for tasks assessed/performed             Past Medical History:  Diagnosis Date   Environmental allergies    Eosinophilic esophagitis    Eosinophilic esophagitis 04/07/2016   Formatting of this note might be different from the original. STs on 04/06/16 were negative.  Status Updated 2021- Negative test results for EOE from endoscopy   IBS (irritable bowel syndrome)     Past Surgical History:  Procedure Laterality Date   BUNIONECTOMY Right 2014   COLONOSCOPY     TONSILLECTOMY  2004   UPPER GI ENDOSCOPY     UPPER GI ENDOSCOPY     Checked for EOE status. Labs returned saying EOE was negative.    WISDOM TOOTH EXTRACTION      There were no vitals filed for this visit.   Subjective Assessment - 07/24/21 1708     Subjective Patient reports no new changes/complaints. Still continues to feel that horizontal head turns.    Pertinent History IBS, eosinophilic esophagitis, idiopathic hypersomnia, ADHD    Patient Stated Goals Go on [address]; No Nauseous with Seated; Completed a light workout    Currently in Pain? No/denies               [facility] Adult PT Treatment/Exercise - 07/24/21 0001       Therapeutic Activites    Therapeutic Activities Other Therapeutic Activities    Other Therapeutic Activities completed  visual tracking with ball: completed diagonal x 10 reps each direction, then progressed to CW circles x 10 reps. Continue to have increased challenge with tracking to R Side with continued mild gaze evoked nystagmus. Completed self ball toss with visual tracking x 10 reps, trialed to R/L with no significant difference. Completed ambulation with horizontal head turns to card x 230 ft, mild dizziness. Paitent reports increased challenge with head turns to L.             Vestibular Treatment/Exercise - 07/24/21 0001       Vestibular Treatment/Exercise   Vestibular Treatment Provided Gaze    Gaze Exercises Eye/Head Exercise Horizontal;Eye/Head Exercise Vertical;X1 Viewing Horizontal;X1 Viewing Vertical      X1 Viewing Horizontal   Foot Position standing feet apart patterned background; with mirrors surrounding    Reps 2    Comments x 60 seconds; 3/10 dizziness      X1 Viewing Vertical   Foot Position standing feet apart patterned background; with mirrors surrounding    Reps 2    Comments x 60 seconds. no significant increase in dizziness with use of mirror.      Eye/Head Exercise Horizontal   Foot Position seated    Comments completed horizontal saccades x 10 reps with slwo pace, then x 10 reps with  progressing speed of saccades. increased dizziness with increased speed. Completed VOR Cancellation x 10 reps, with significant dizziness.      Eye/Head Exercise Vertical   Foot Position seated    Comments completed vertical saccades x 10 reps; no smyptoms reported.                 PT Short Term Goals - 07/10/21 1756       PT SHORT TERM GOAL #1   Title Patient will be independent with initial Vestibular/Balance HEP (All STGS: 07/11/21)    Baseline no HEP established; reports independence    Time 4    Period Weeks    Status Achieved    Target Date 07/11/21      PT SHORT TERM GOAL #2   Title Further Balance Assesment to be compelted and LTG to be set for FGA/SOT    Baseline  LTG set    Time 4    Period Weeks    Status Achieved               PT Long Term Goals - 07/10/21 1755       PT LONG TERM GOAL #1   Title Patient will be independent with Final Vestibular/Balance HEP (All LTGs Due: 08/08/21)    Baseline no HEP established    Time 8    Period Weeks    Status New      PT LONG TERM GOAL #2   Title Patient will improve DPS >/= 50, and DFS >/= 45    Baseline DPS: 43, DFS: 38.9    Time 8    Period Weeks    Status New      PT LONG TERM GOAL #3   Title Patient will improve Composite Score on SOT to >/= 80% to demonstrate improved balance    Baseline 68%    Time 8    Period Weeks    Status Revised      PT LONG TERM GOAL #4   Title Patient will improve FGA to >/= 29/30    Baseline 27/30    Time 8    Period Weeks    Status Revised      PT LONG TERM GOAL #5   Title Patient will report ability to return to gym activities with no dizziness and no imbalance    Baseline imbalance reported    Time 8    Period Weeks    Status New                   Plan - 07/24/21 1758     Clinical Impression Statement Continued visual tracking, VOr, and saccade activities today with  patient tolerating well. Intermittent dizziness with activities reported but reoslution quickly noted. will continue to progress toward all LTGs.    Personal Factors and Comorbidities Comorbidity 2;Time since onset of injury/illness/exacerbation    Comorbidities IBS, eosinophilic esophagitis, idiopathic hypersomnia, ADHD    Examination-Activity Limitations Stand;Reach Overhead;Bend    Examination-Participation Restrictions Driving;Community Activity;Yard Work;Cleaning;Occupation    Stability/Clinical Decision Making Stable/Uncomplicated    Rehab Potential Good    PT Frequency 1x / week    PT Duration 8 weeks    PT Treatment/Interventions ADLs/Self Care Home Management;Canalith Repostioning;Cryotherapy;Electrical Stimulation;Moist Heat;Gait training;Stair  training;Functional mobility training;Therapeutic activities;Therapeutic exercise;Balance training;Neuromuscular re-education;Patient/family education;Manual techniques;Passive range of motion;Dry needling;Vestibular;Spinal Manipulations;Joint Manipulations    PT Next Visit Plan Continue visual tracking/saccade/smooth pursuit training. Progress VOR. Continue High Level  Balance    Consulted and Agree with  Plan of Care Patient             Patient will benefit from skilled therapeutic intervention in order to improve the following deficits and impairments:  Decreased balance, Decreased activity tolerance, Dizziness, Decreased range of motion, Difficulty walking, Pain  Visit Diagnosis: Unsteadiness on feet  Dizziness and giddiness  Other abnormalities of gait and mobility     Problem List Patient Active Problem List   Diagnosis Date Noted   Family history of breast cancer in mother 10/30/2020   Family history of uterine cancer 10/30/2020   Family history of cervical cancer 10/30/2020   Family history of amyotrophic lateral sclerosis 10/30/2020   Family history of high cholesterol 10/30/2020   Family history of type II diabetes mellitus 10/30/2020   Family history of Parkinson's disease 10/30/2020   MDD (major depressive disorder), recurrent severe, 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 [place], [place], [address] Phone: [phone]   Fax:  [phone]  Name: Patricia Harvey MRN: [id number] Date of Birth: 1991-06-27

## 2021-07-31 IMAGING — MG MM DIGITAL DIAGNOSTIC UNILAT*L* W/ TOMO W/ CAD
6 series · 6 of 18 positions shown · non-contrast
Comparison: Previous exam(s).

CLINICAL DATA: Patient presents for evaluation of palpable
abnormality within the outer aspect of the left breast.

EXAM:
DIGITAL DIAGNOSTIC LEFT MAMMOGRAM WITH CAD AND TOMO
ULTRASOUND LEFT BREAST

[L CC synth-2D (1 of 2)]
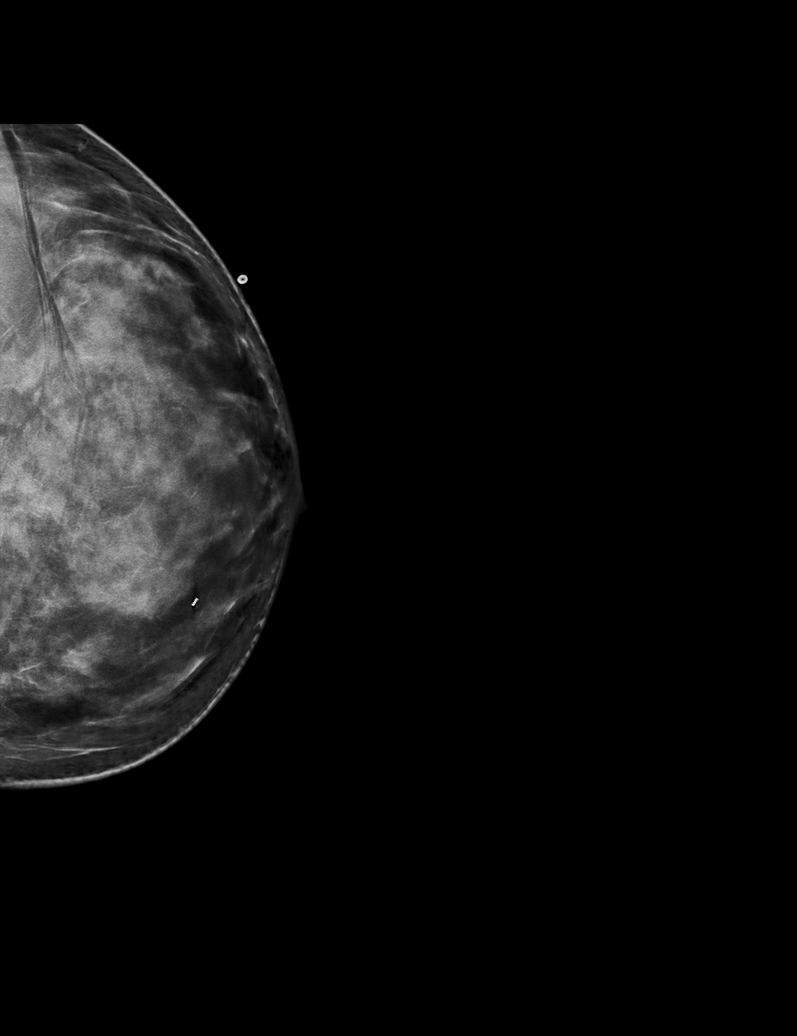

[L CC synth-2D (2 of 2)]
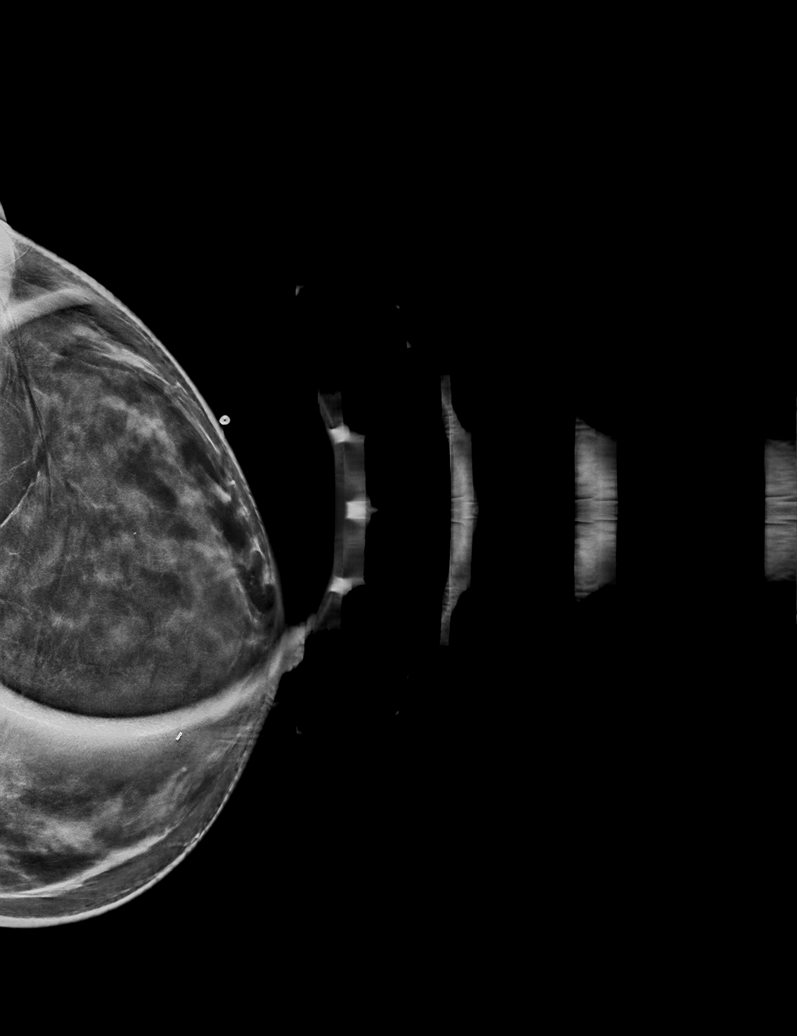

[L MLO synth-2D]
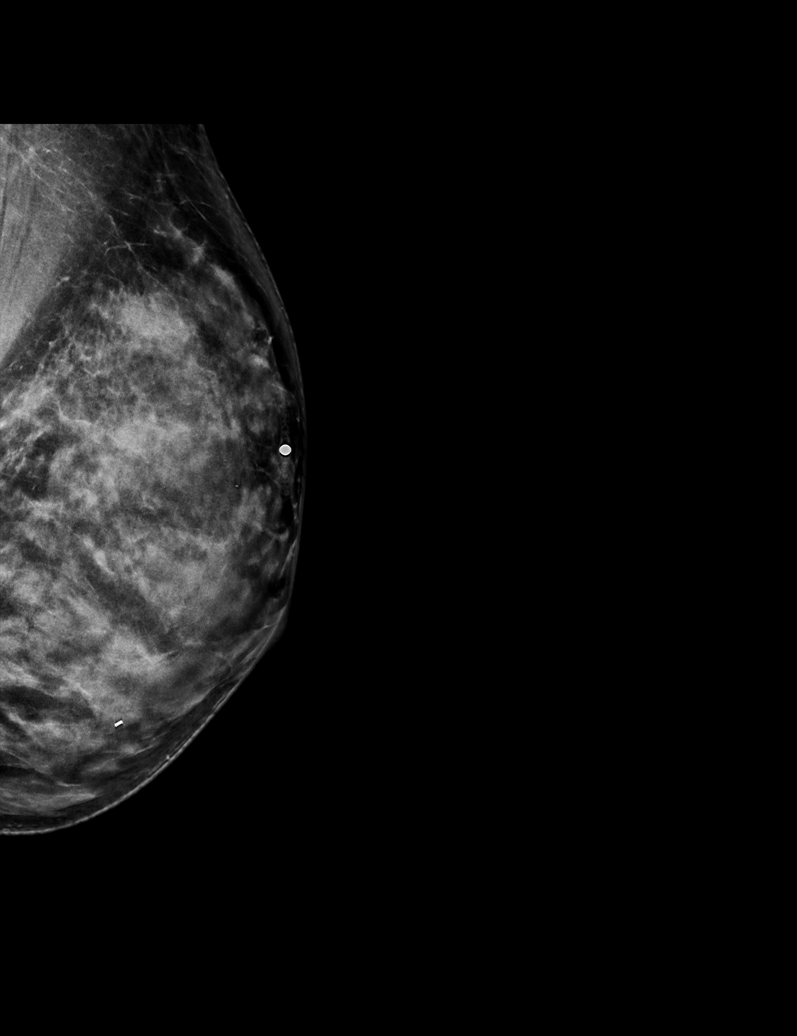

[L MLO tomo · tomo slice 34/67.0]
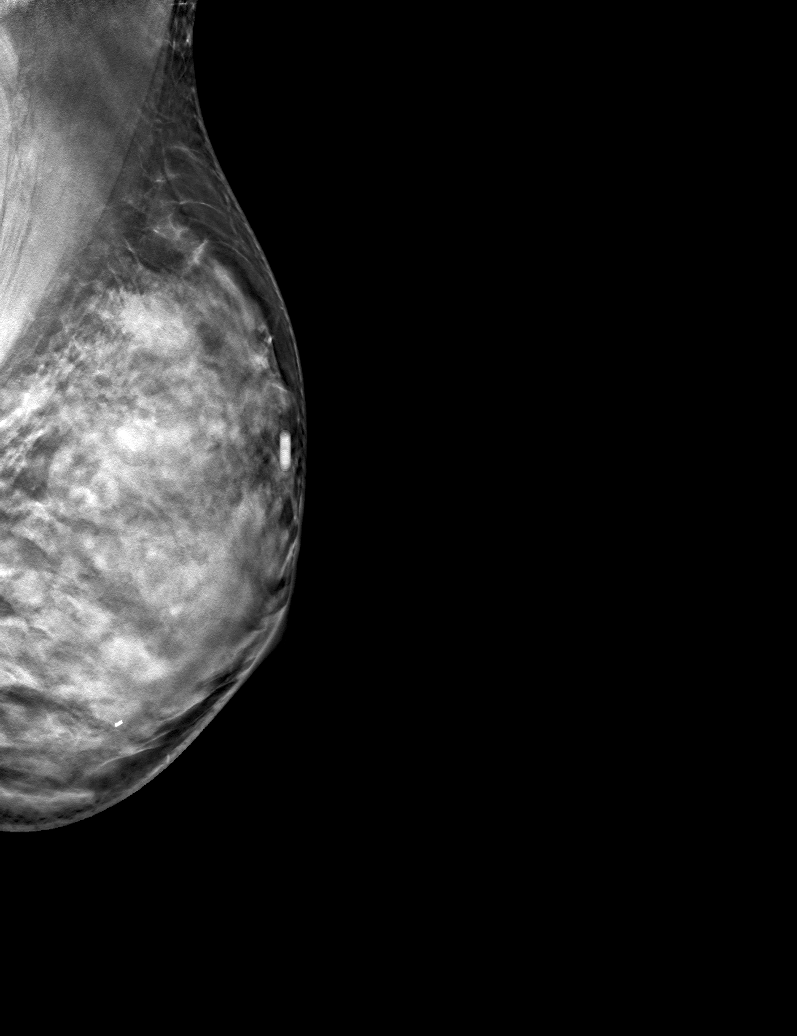

[L CC tomo (1 of 2) · tomo slice 41/81.0]
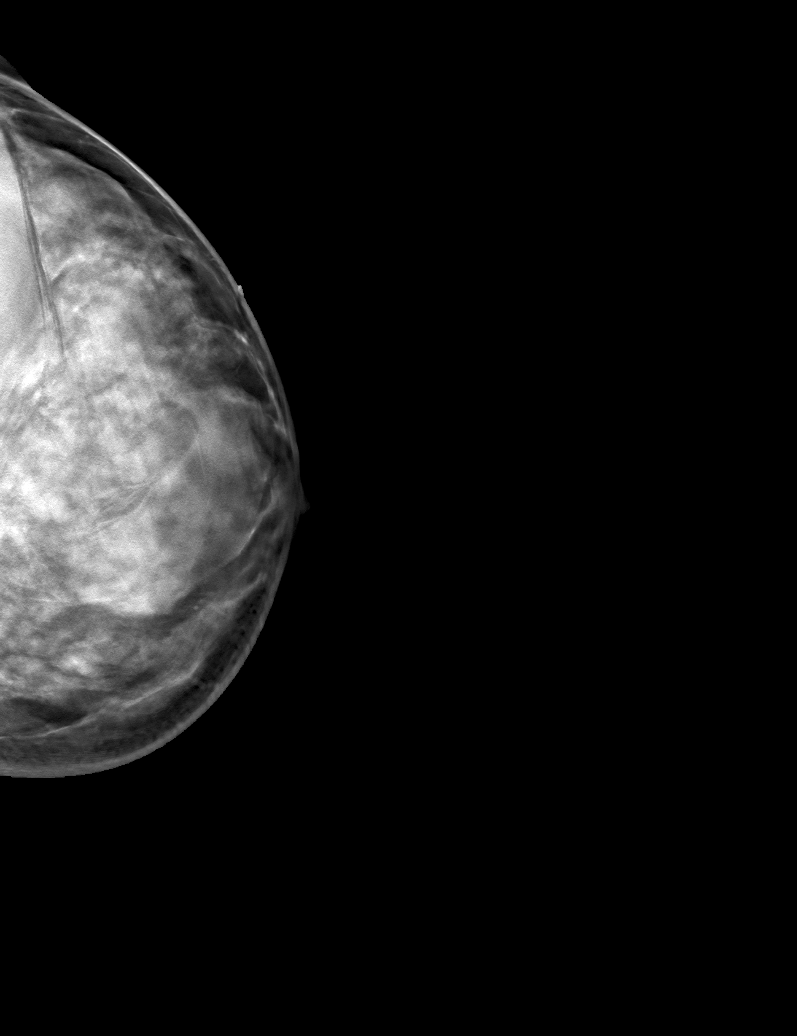

[L CC tomo (2 of 2) · tomo slice 37/74.0]
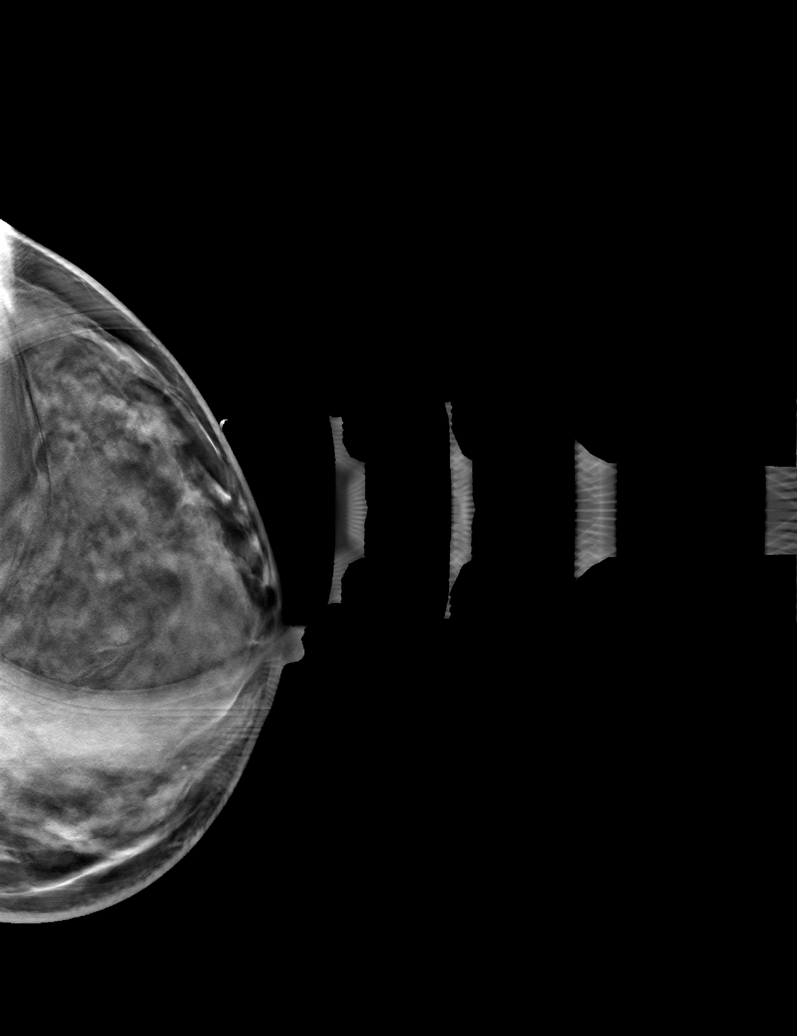

[6 of 18 positions shown; findings below may reference images not displayed]

ACR Breast Density Category d: The breast tissue is extremely dense,
which lowers the sensitivity of mammography.
FINDINGS: No concerning masses, calcifications or distortion identified within
either breast. Biopsy marking clip located within the inner lower
left breast.

Mammographic images were processed with CAD.

On physical exam, dense tissue is palpated within the upper-outer
left breast.

Targeted ultrasound is performed, showing normal dense tissue
without suspicious mass within the left breast 1-2 o'clock position
4 cm from nipple at the patient reported site of palpable concern.
IMPRESSION: No mammographic or sonographic evidence for malignancy.

RECOMMENDATION:
Recommend bilateral breast MRI as the patient is overdue for
follow-up of previously biopsied left breast mass found on prior
breast MRI. Additionally, patient is high risk with new clinical
findings of palpable concern left breast.

Annual high risk screening mammography [DATE].

I have discussed the findings and recommendations with the patient.
If applicable, a reminder letter will be sent to the patient
regarding the next appointment.

BI-RADS CATEGORY  2: Benign.

## 2021-08-08 ENCOUNTER — Ambulatory Visit
Admission: RE | Admit: 2021-08-08 | Discharge: 2021-08-08 | Disposition: A | Discharge status: Home / Self Care | Payer: No Typology Code available for payment source | Source: Ambulatory Visit | Attending: Obstetrics & Gynecology | Admitting: Obstetrics & Gynecology

## 2021-08-08 DIAGNOSIS — Z803 Family history of malignant neoplasm of breast: Secondary | ICD-10-CM

## 2021-08-08 MED ORDER — GADOBUTROL 1 MMOL/ML IV SOLN
6.0000 mL | Freq: Once | INTRAVENOUS | Status: AC | PRN
  Administered 2021-08-08: 6 mL via INTRAVENOUS

## 2021-08-11 DIAGNOSIS — J3081 Allergic rhinitis due to animal (cat) (dog) hair and dander: Secondary | ICD-10-CM

## 2021-08-11 NOTE — Progress Notes (Signed)
VIAL MADE. EXP 08-11-22

## 2021-08-14 ENCOUNTER — Other Ambulatory Visit: Payer: Self-pay | Admitting: Obstetrics & Gynecology

## 2021-08-14 ENCOUNTER — Other Ambulatory Visit: Payer: Self-pay

## 2021-08-14 ENCOUNTER — Other Ambulatory Visit (HOSPITAL_COMMUNITY): Payer: Self-pay

## 2021-08-14 ENCOUNTER — Ambulatory Visit: Payer: No Typology Code available for payment source | Attending: Neurology

## 2021-08-14 DIAGNOSIS — R9389 Abnormal findings on diagnostic imaging of other specified body structures: Secondary | ICD-10-CM

## 2021-08-14 DIAGNOSIS — R2689 Other abnormalities of gait and mobility: Secondary | ICD-10-CM | POA: Diagnosis present

## 2021-08-14 DIAGNOSIS — R42 Dizziness and giddiness: Secondary | ICD-10-CM | POA: Diagnosis present

## 2021-08-14 DIAGNOSIS — R2681 Unsteadiness on feet: Secondary | ICD-10-CM

## 2021-08-14 MED ORDER — DIAZEPAM 5 MG PO TABS
ORAL_TABLET | ORAL | 0 refills | Status: DC
  Filled 2021-08-14: qty 3, 3d supply, fill #0

## 2021-08-14 NOTE — Patient Instructions (Signed)
Access Code: [email or web address] URL: [email or web address] Date: 08/14/2021 Prepared by: Jethro Bastos  Exercises Romberg Stance Eyes Closed on Foam Pad - 1 x daily - 7 x weekly - 1 sets - 3 reps - 30 seconds hold Standing Ball Toss with Visual Tracking - 1 x daily - 7 x weekly - 2 sets - 10 reps Standing Diagonal Visual Tracking with Ball - 1 x daily - 7 x weekly - 2 sets - 10 reps

## 2021-08-14 NOTE — Therapy (Signed)
[facility] [address] [place], [place], [address] Phone: [phone]   Fax:  [phone]  Physical Therapy Treatment/Re-Certification for D/C Visit/Discharge Summary  Patient Details  Name: Patricia Harvey MRN: [id number] Date of Birth: [birth year] Referring Provider (PT): Ellouise Newer, MD  PHYSICAL THERAPY DISCHARGE SUMMARY  Visits from Start of Care: 5  Current functional level related to goals / functional outcomes: See Clinical Impression Statement   Remaining deficits: Mild Visual Motion Sensitivity   Education / Equipment: HEP Provided   Patient agrees to discharge. Patient goals were met. Patient is being discharged due to meeting the stated rehab goals.  Encounter Date: 08/14/2021   PT End of Session - 08/14/21 1705     Visit Number 5    Number of Visits 9    Date for PT Re-Evaluation 08/15/21    Authorization Type [employer] (12 Visits Approved)    PT Start Time 1707   patient late and needing to use bathroom prior to start of session   PT Stop Time 1749    PT Time Calculation (min) 42 min    Activity Tolerance Patient tolerated treatment well    Behavior During Therapy [facility] for tasks assessed/performed             Past Medical History:  Diagnosis Date   Environmental allergies    Eosinophilic esophagitis    Eosinophilic esophagitis 01/08/9241   Formatting of this note might be different from the original. STs on 04/06/16 were negative.  Status Updated 2021- Negative test results for EOE from endoscopy   IBS (irritable bowel syndrome)     Past Surgical History:  Procedure Laterality Date   BUNIONECTOMY Right 2014   COLONOSCOPY     TONSILLECTOMY  2004   UPPER GI ENDOSCOPY     UPPER GI ENDOSCOPY     Checked for EOE status. Labs returned saying EOE was negative.    WISDOM TOOTH EXTRACTION      There were no vitals filed for this visit.   Subjective Assessment - 08/14/21 1706     Subjective  Patient reports that she has had a bad few days. Patient reports hasn't been paying attention to the dizziness. Patient report she is working out and some dizziness with working out.    Pertinent History IBS, eosinophilic esophagitis, idiopathic hypersomnia, ADHD    Patient Stated Goals Go on Long Walks; No Nauseous with Seated; Completed a light workout    Currently in Pain? No/denies                [facility] PT Assessment - 08/14/21 0001       Assessment   Medical Diagnosis Dizziness    Referring Provider (PT) Ellouise Newer, MD      Functional Gait  Assessment   Gait assessed  Yes    Gait Level Surface Walks 20 ft in less than 5.5 sec, no assistive devices, good speed, no evidence for imbalance, normal gait pattern, deviates no more than 6 in outside of the 12 in walkway width.    Change in Gait Speed Able to smoothly change walking speed without loss of balance or gait deviation. Deviate no more than 6 in outside of the 12 in walkway width.    Gait with Horizontal Head Turns Performs head turns smoothly with slight change in gait velocity (eg, minor disruption to smooth gait path), deviates 6-10 in outside 12 in walkway width, or uses an assistive device.    Gait with Vertical  Head Turns Performs head turns with no change in gait. Deviates no more than 6 in outside 12 in walkway width.    Gait and Pivot Turn Pivot turns safely within 3 sec and stops quickly with no loss of balance.    Step Over Obstacle Is able to step over 2 stacked shoe boxes taped together (9 in total height) without changing gait speed. No evidence of imbalance.    Gait with Narrow Base of Support Is able to ambulate for 10 steps heel to toe with no staggering.    Gait with Eyes Closed Walks 20 ft, no assistive devices, good speed, no evidence of imbalance, normal gait pattern, deviates no more than 6 in outside 12 in walkway width. Ambulates 20 ft in less than 7 sec.    Ambulating Backwards Walks 20 ft, no assistive  devices, good speed, no evidence for imbalance, normal gait    Steps Alternating feet, no rail.    Total Score 29    FGA comment: 29/30             Neuro re-ed: sensory organization test performed with following results: Conditions: 1: 1st below, 2nd & 36rd above age norm 2: all above age norm 42: all above age norm  45: all above age norm 21: all above age norm 17: all above age norm Composite score: 79% (WNL)  Sensory Analysis Som: WNL Vis: WNL Vest: WNL Pref: WNL Strategy analysis: Ankle Dominant       COG alignment: slighty posterior COG         Reviewed and Updated HEP:  Access Code: [email or web address] URL: [email or web address] Date: 08/14/2021 Prepared by: Baldomero Lamy   Exercises Romberg Stance Eyes Closed on Foam Pad - 1 x daily - 7 x weekly - 1 sets - 3 reps - 30 seconds hold Standing Ball Toss with Visual Tracking - 1 x daily - 7 x weekly - 2 sets - 10 reps Standing Diagonal Visual Tracking with Ball - 1 x daily - 7 x weekly - 2 sets - 10 reps     PT Education - 08/14/21 1713     Education Details Progress toward LTGs; Updated HEP    Person(s) Educated Patient    Methods Explanation;Handout    Comprehension Verbalized understanding              PT Short Term Goals - 07/10/21 1756       PT SHORT TERM GOAL #1   Title Patient will be independent with initial Vestibular/Balance HEP (All STGS: 07/11/21)    Baseline no HEP established; reports independence    Time 4    Period Weeks    Status Achieved    Target Date 07/11/21      PT SHORT TERM GOAL #2   Title Further Balance Assesment to be compelted and LTG to be set for FGA/SOT    Baseline LTG set    Time 4    Period Weeks    Status Achieved               PT Long Term Goals - 08/14/21 1714       PT LONG TERM GOAL #1   Title Patient will be independent with Final Vestibular/Balance HEP (All LTGs Due: 08/08/21)    Baseline no HEP established; reports independence with final HEP     Time 8    Period Weeks    Status Achieved      PT LONG TERM GOAL #2   Title  Patient will improve DPS >/= 50, and DFS >/= 45    Baseline DPS: 43, DFS: 38.9; not assessed due to psession limited due to late arrival    Time 8    Period Weeks    Status Deferred      PT LONG TERM GOAL #3   Title Patient will improve Composite Score on SOT to >/= 80% to demonstrate improved balance    Baseline 68%; 79%    Time 8    Period Weeks    Status Partially Met      PT LONG TERM GOAL #4   Title Patient will improve FGA to >/= 29/30    Baseline 27/30; 29/30    Time 8    Period Weeks    Status Achieved      PT LONG TERM GOAL #5   Title Patient will report ability to return to gym activities with no dizziness and no imbalance    Baseline imbalance reported; returned to gym activities, mild imbalance reported.    Time 8    Period Weeks    Status Partially Met                   Plan - 08/14/21 1939     Clinical Impression Statement Today's skilled PT session included assesment of progress toward LTG. Patient able to meet/partially meet all LTG today demonstrating improved balance. Patient scored 29/30 on FGA, and improved composite score on SOT to 79%. All sensroy systems aon SOT are within normal limits for age. Patient has demonstrated significant progress with PT services and demo readiness for d/c at this time. PT educating on potential for evaluation by opthamalogist due to continued visual senstivity and nystagmus. Patient verbalized understanding and agreeable to d/c.    Personal Factors and Comorbidities Comorbidity 2;Time since onset of injury/illness/exacerbation    Comorbidities IBS, eosinophilic esophagitis, idiopathic hypersomnia, ADHD    Examination-Activity Limitations Stand;Reach Overhead;Bend    Examination-Participation Restrictions Driving;Community Activity;Yard Work;Cleaning;Occupation    Stability/Clinical Decision Making Stable/Uncomplicated    Rehab Potential  Good    PT Frequency 1x / week    PT Duration 8 weeks    PT Treatment/Interventions ADLs/Self Care Home Management;Canalith Repostioning;Cryotherapy;Electrical Stimulation;Moist Heat;Gait training;Stair training;Functional mobility training;Therapeutic activities;Therapeutic exercise;Balance training;Neuromuscular re-education;Patient/family education;Manual techniques;Passive range of motion;Dry needling;Vestibular;Spinal Manipulations;Joint Manipulations    Consulted and Agree with Plan of Care Patient             Patient will benefit from skilled therapeutic intervention in order to improve the following deficits and impairments:  Decreased balance, Decreased activity tolerance, Dizziness, Decreased range of motion, Difficulty walking, Pain  Visit Diagnosis: Unsteadiness on feet  Dizziness and giddiness  Other abnormalities of gait and mobility     Problem List Patient Active Problem List   Diagnosis Date Noted   Family history of breast cancer in mother 10/30/2020   Family history of uterine cancer 10/30/2020   Family history of cervical cancer 10/30/2020   Family history of amyotrophic lateral sclerosis 10/30/2020   Family history of high cholesterol 10/30/2020   Family history of type II diabetes mellitus 10/30/2020   Family history of Parkinson's disease 10/30/2020   MDD (major depressive disorder), recurrent severe, without psychosis ([place]) 12/26/2019   Pre-nodular edema of the vocal folds 06/09/2019   Anxiety 05/04/2019   At high risk for breast cancer 05/04/2019   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   Posttraumatic stress disorder 05/04/2019   Vertigo 05/04/2019   Cervical radiculopathy 03/30/2019   Scoliosis  deformity of spine 08/04/2018   Degeneration of lumbar intervertebral disc 08/04/2018   Allergic rhinitis 04/07/2016    Jones Bales, PT, DPT 08/14/2021, 7:44 PM  [facility] [address] [place], [place], [address] Phone: [phone]   Fax:  [phone]  Name: Bryanah Sidell MRN: [id number] Date of Birth: 12-21-1990

## 2021-08-21 ENCOUNTER — Ambulatory Visit (INDEPENDENT_AMBULATORY_CARE_PROVIDER_SITE_OTHER): Payer: No Typology Code available for payment source | Admitting: *Deleted

## 2021-08-21 ENCOUNTER — Ambulatory Visit: Payer: No Typology Code available for payment source

## 2021-08-21 DIAGNOSIS — J309 Allergic rhinitis, unspecified: Secondary | ICD-10-CM | POA: Diagnosis not present

## 2021-08-25 ENCOUNTER — Telehealth: Payer: Self-pay | Admitting: Allergy

## 2021-08-25 NOTE — Telephone Encounter (Signed)
Called patient to schedule OV for insurance purposes regarding allergy injections. Patient requested I make a note that she has scheduled an appointment for 11/06/2021 with Dr. Delorse Lek so that her injections are covered. I did advise patient as long as she kept appointment her injections would be covered. Patient insisted I make a note.

## 2021-08-27 ENCOUNTER — Other Ambulatory Visit: Payer: No Typology Code available for payment source

## 2021-08-29 ENCOUNTER — Other Ambulatory Visit (HOSPITAL_COMMUNITY): Payer: Self-pay | Admitting: Diagnostic Radiology

## 2021-08-29 ENCOUNTER — Other Ambulatory Visit: Payer: Self-pay

## 2021-08-29 ENCOUNTER — Ambulatory Visit
Admission: RE | Admit: 2021-08-29 | Discharge: 2021-08-29 | Disposition: A | Discharge status: Home / Self Care | Payer: No Typology Code available for payment source | Source: Ambulatory Visit | Attending: Obstetrics & Gynecology | Admitting: Obstetrics & Gynecology

## 2021-08-29 DIAGNOSIS — R9389 Abnormal findings on diagnostic imaging of other specified body structures: Secondary | ICD-10-CM

## 2021-08-29 MED ORDER — GADOBUTROL 1 MMOL/ML IV SOLN
6.0000 mL | Freq: Once | INTRAVENOUS | Status: AC | PRN
  Administered 2021-08-29: 6 mL via INTRAVENOUS

## 2021-09-10 ENCOUNTER — Ambulatory Visit (INDEPENDENT_AMBULATORY_CARE_PROVIDER_SITE_OTHER): Payer: No Typology Code available for payment source

## 2021-09-10 DIAGNOSIS — J309 Allergic rhinitis, unspecified: Secondary | ICD-10-CM | POA: Diagnosis not present

## 2021-09-11 ENCOUNTER — Ambulatory Visit: Payer: No Typology Code available for payment source

## 2021-09-30 ENCOUNTER — Ambulatory Visit (INDEPENDENT_AMBULATORY_CARE_PROVIDER_SITE_OTHER): Payer: No Typology Code available for payment source | Admitting: *Deleted

## 2021-09-30 DIAGNOSIS — J309 Allergic rhinitis, unspecified: Secondary | ICD-10-CM | POA: Diagnosis not present

## 2021-10-21 ENCOUNTER — Ambulatory Visit (INDEPENDENT_AMBULATORY_CARE_PROVIDER_SITE_OTHER): Payer: No Typology Code available for payment source

## 2021-10-21 DIAGNOSIS — J309 Allergic rhinitis, unspecified: Secondary | ICD-10-CM | POA: Diagnosis not present

## 2021-11-06 ENCOUNTER — Ambulatory Visit (INDEPENDENT_AMBULATORY_CARE_PROVIDER_SITE_OTHER): Payer: No Typology Code available for payment source | Admitting: Allergy

## 2021-11-06 ENCOUNTER — Other Ambulatory Visit: Payer: Self-pay

## 2021-11-06 ENCOUNTER — Encounter: Payer: Self-pay | Admitting: Allergy

## 2021-11-06 ENCOUNTER — Other Ambulatory Visit (HOSPITAL_COMMUNITY): Payer: Self-pay

## 2021-11-06 VITALS — BP 100/68 | HR 78 | Temp 98.0°F | Resp 20 | Ht 63.0 in | Wt 141.0 lb

## 2021-11-06 DIAGNOSIS — K200 Eosinophilic esophagitis: Secondary | ICD-10-CM | POA: Diagnosis not present

## 2021-11-06 DIAGNOSIS — H1013 Acute atopic conjunctivitis, bilateral: Secondary | ICD-10-CM | POA: Diagnosis not present

## 2021-11-06 DIAGNOSIS — J3089 Other allergic rhinitis: Secondary | ICD-10-CM | POA: Diagnosis not present

## 2021-11-06 DIAGNOSIS — J4520 Mild intermittent asthma, uncomplicated: Secondary | ICD-10-CM | POA: Diagnosis not present

## 2021-11-06 MED ORDER — OLOPATADINE HCL 0.2 % OP SOLN
1.0000 [drp] | Freq: Every day | OPHTHALMIC | 11 refills | Status: DC | PRN
  Filled 2021-11-06: qty 2.5, 25d supply, fill #0

## 2021-11-06 MED ORDER — ALBUTEROL SULFATE (2.5 MG/3ML) 0.083% IN NEBU
INHALATION_SOLUTION | RESPIRATORY_TRACT | 5 refills | Status: DC
  Filled 2021-11-06: qty 90, 5d supply, fill #0

## 2021-11-06 MED ORDER — EPINEPHRINE 0.3 MG/0.3ML IJ SOAJ
INTRAMUSCULAR | 0 refills | Status: DC
  Filled 2021-11-06: qty 4, 30d supply, fill #0

## 2021-11-06 MED ORDER — ALBUTEROL SULFATE HFA 108 (90 BASE) MCG/ACT IN AERS
2.0000 | INHALATION_SPRAY | RESPIRATORY_TRACT | 2 refills | Status: DC | PRN
  Filled 2021-11-06: qty 18, 16d supply, fill #0

## 2021-11-06 MED ORDER — LEVOCETIRIZINE DIHYDROCHLORIDE 5 MG PO TABS
5.0000 mg | ORAL_TABLET | Freq: Two times a day (BID) | ORAL | 11 refills | Status: DC | PRN
Start: 2021-11-06 — End: 2022-03-19
  Filled 2021-11-06: qty 60, 30d supply, fill #0

## 2021-11-06 MED ORDER — MONTELUKAST SODIUM 10 MG PO TABS
10.0000 mg | ORAL_TABLET | Freq: Every day | ORAL | 11 refills | Status: DC
  Filled 2021-11-06: qty 30, 30d supply, fill #0

## 2021-11-06 NOTE — Progress Notes (Signed)
Follow-up Note  RE: Patricia Harvey MRN: [id number] DOB: October 19, 1991 Date of Office Visit: 11/06/2021   History of present illness: Patricia Harvey is a 30 y.o. female presenting today for follow-up of allergic rhinitis with conjunctivitis and eosinophilic esophagitis.  She was last seen in the office on 04/04/2020 by myself.   She is on allergen immunotherapy at maintenance monthly dosing.  She states she does notice if she misses a dose of Xyzal that she has some increase in her allergy symptoms.  She does have Pataday for as needed use for itchy watery eyes.  She is planning on starting IVF in January and she has been discussion with her fertility specialist regarding this.  She has been advised that the allergy shots could have some impact potentially on implantation with IVF.  Thus she is wondering today if stopping allergy shots would be a good idea.  She can continue.  In the past she has been on Singulair and Allegra for more allergy symptom control. She does have EOE that is controlled with dietary modification. She has had COVID illness and with most recent illness she did note some wheezing.  She did have access to an albuterol that she would like to have refilled.  She is also in the past has used Symbicort with initial COVID illness that she did have right upper respiratory symptoms.  Review of systems: Review of Systems  Constitutional: Negative.   HENT: Negative.    Eyes: Negative.   Respiratory: Negative.    Cardiovascular: Negative.   Gastrointestinal: Negative.   Musculoskeletal: Negative.   Skin: Negative.   Allergic/Immunologic: Negative.   Neurological: Negative.     All other systems negative unless noted above in HPI  Past medical/social/surgical/family history have been reviewed and are unchanged unless specifically indicated below.  No changes  Medication List: Current Outpatient Medications  Medication Sig Dispense Refill   Armodafinil 50 MG tablet Take 100 mg  by mouth daily. Take two 50 mg tablets twice a day     azelastine (ASTELIN) 0.1 % nasal spray Place into the nose.     Bromelains 500 MG TABS Take by mouth.     busPIRone (BUSPAR) 10 MG tablet Take 5-10 mg by mouth 3 (three) times daily.     busPIRone (BUSPAR) 10 MG tablet Take 1/2 to 1 tablet by mouth three times daily with food. 270 tablet 1   diazepam (VALIUM) 5 MG tablet Take one tablet 30 minutes prior to procedure 3 tablet 0   fluticasone (FLONASE) 50 MCG/ACT nasal spray 2 sprays by Each Nare route daily.     folic acid (FOLVITE) 1 MG tablet Take 1 mg by mouth daily.     folic acid (FOLVITE) 400 MCG tablet TAKE 1 TABLET BY MOUTH DAILY 90 tablet 3   ipratropium-albuterol (DUONEB) 0.5-2.5 (3) MG/3ML SOLN INHALE 3 ML BY NEBULIZATION EVERY 6 HOURS AS NEEDED 90 mL 1   lamoTRIgine (LAMICTAL) 100 MG tablet Take 100 mg by mouth daily. Take one tablet daily     lamoTRIgine (LAMICTAL) 25 MG tablet Take 25 mg by mouth every morning. Take one tablet daily     levocetirizine (XYZAL) 5 MG tablet Take 1 tablet by mouth up to 2 (two) times daily as needed for allergies. May take extra dose if needed for flares. 60 tablet 11   methylphenidate (RITALIN) 5 MG tablet Take 1 tablet by mouth on an empty stomach once daily 30 tablet 0   montelukast (SINGULAIR)  10 MG tablet Take 1 tablet (10 mg total) by mouth at bedtime. 30 tablet 11   Nirmatrelvir & Ritonavir (PAXLOVID) 20 x 150 MG & 10 x 100MG  TBPK Take 3 tablets by mouth twice a day for 5 days as directed 30 each 0   Olopatadine HCl 0.2 % SOLN Apply 1 drop to affected eye daily as needed. 2.5 mL 11   ondansetron (ZOFRAN) 4 MG tablet Take 4 mg by mouth daily.     TURMERIC PO Take 1 tablet by mouth daily.     albuterol (PROVENTIL) (2.5 MG/3ML) 0.083% nebulizer solution USE 1 VIAL VIA NEBULIZER EVERY 4 TO 6 HOURS AS NEEDED FOR WHEEZING 75 mL 5   albuterol (VENTOLIN HFA) 108 (90 Base) MCG/ACT inhaler Inhale 2 puffs into the lungs every 4 (four) hours as needed for  wheezing or shortness of breath. 18 g 2   EPINEPHrine 0.3 mg/0.3 mL IJ SOAJ injection INJECT 0.3 MG INTO THE MUSCLE ONCE FOR 1 DOSE AS NEEDED FOR LIFE THREATENING ALLERGIC REACTION. 6 each 0   No current facility-administered medications for this visit.     Known medication allergies: Allergies  Allergen Reactions   Grass Extracts [Gramineae Pollens] Itching   Msud Aid [Alitraq] Diarrhea   Other Shortness Of Breath and Other (See Comments)    Steroids, panic attack   Sesame Seed (Diagnostic) Shortness Of Breath, Diarrhea, Nausea Only and Other (See Comments)    cramping   Amphetamine-Dextroamphetamine    Concerta [Methylphenidate]    Milk-Related Compounds Diarrhea and Nausea And Vomiting    Cramping & bloating also   Prazosin     Syncope   Wheat Bran Nausea Only and Other (See Comments)    Cramping, lower GI pain, nausea, constipation     Physical examination: Blood pressure 100/68, pulse 78, temperature 98 F (36.7 C), temperature source Temporal, resp. rate 20, height 5\' 3"  (1.6 m), weight 141 lb (64 kg), SpO2 99 %.  General: Alert, interactive, in no acute distress. HEENT: PERRLA, TMs pearly gray, turbinates non-edematous without discharge, post-pharynx non erythematous. Neck: Supple without lymphadenopathy. Lungs: Clear to auscultation without wheezing, rhonchi or rales. {no increased work of breathing. CV: Normal S1, S2 without murmurs. Abdomen: Nondistended, nontender. Skin: Warm and dry, without lesions or rashes. Extremities:  No clubbing, cyanosis or edema. Neuro:   Grossly intact.  Diagnositics/Labs: None today  Assessment and plan: Allergic rhinitis with conjunctivitis   - symptoms improved on immunotherapy   - Continue avoidance measures   - Continue xyzal daily.   If symptoms worsen can resume Allegra and Singulair use as previously   - Continue Pataday eye drops daily as needed for itchy/watery eyes   -Continuation of allergy and immunotherapy discussed  today.  I did discuss with her that immunotherapy does not need to be discontinued due to pregnancy.  We typically will freeze the dose or ever someone is when they do become pregnant.  She is in maintenance dosing thus if she decides to continue she would remain at maintenance dosing.  However there is some concern it seems with implantation in regards to immunotherapy and thus discontinuing immunotherapy is also an option.  Where she is in her immunotherapy she has had at least 3 years and thus this may be sufficient enough time to discontinue anyway.  We have decided to hold her December dose and see how she does off and if she does fine then she will discontinue immunotherapy.  If she has a surge of allergy  symptoms then resuming would be recommended and continue throughout pregnancy when that occurs.  EoE  - esophageal eosinophils are <15/hpf!  - continue current dietary avoidance for management of GI symptoms  Reactive airway   - increase with respiratory symptoms like wheezing cough related to respiratory illnesses   - recommend she have access to albuterol inhaler 2 puffs or 1 vial via nebulizer every 4-6 hours as needed for cough/wheeze/shortness of breath/chest tightness.  May use 15-20 minutes prior to activity.   Monitor frequency of use.      -If needed we can prescribe inhaled steroid like Symbicort that she has had good success with in the past to manage reactive airway secondary to COVID illness   Follow-up 12 months for follow-up or sooner if needed  I appreciate the opportunity to take part in [place] care. Please do not hesitate to contact me with questions.  Sincerely,   Margo Aye, MD Allergy/Immunology [facility]

## 2021-11-06 NOTE — Patient Instructions (Addendum)
Allergic rhinitis with conjunctivitis   - symptoms improved on immunotherapy   - Continue avoidance measures   - Continue xyzal daily.   If symptoms worsen can resume Allegra and Singulair use as previously   - Continue Pataday eye drops daily as needed for itchy/watery eyes   -Continuation of allergy and immunotherapy discussed today.  I did discuss with her that immunotherapy does not need to be discontinued due to pregnancy.  We typically will freeze the dose or ever someone is when they do become pregnant.  She is in maintenance dosing thus if she decides to continue she would remain at maintenance dosing.  However there is some concern it seems with implantation in regards to immunotherapy and thus discontinuing immunotherapy is also an option.  Where she is in her immunotherapy she has had at least 3 years and thus this may be sufficient enough time to discontinue anyway.  We have decided to hold her December dose and see how she does off and if she does fine then she will discontinue immunotherapy.  If she has a surge of allergy symptoms then resuming would be recommended and continue throughout pregnancy when that occurs.  EoE  - esophageal eosinophils are <15/hpf!  - continue current dietary avoidance for management of GI symptoms  Reactive airway   - increase with respiratory symptoms like wheezing cough related to respiratory illnesses   - recommend she have access to albuterol inhaler 2 puffs or 1 vial via nebulizer every 4-6 hours as needed for cough/wheeze/shortness of breath/chest tightness.  May use 15-20 minutes prior to activity.   Monitor frequency of use.      -If needed we can prescribe inhaled steroid like Symbicort that she has had good success with in the past to manage reactive airway secondary to COVID illness    Follow-up 12 months for follow-up or sooner if needed

## 2021-11-07 ENCOUNTER — Other Ambulatory Visit (HOSPITAL_COMMUNITY): Payer: Self-pay

## 2021-11-17 ENCOUNTER — Other Ambulatory Visit (HOSPITAL_COMMUNITY): Payer: Self-pay

## 2021-11-19 ENCOUNTER — Other Ambulatory Visit (HOSPITAL_COMMUNITY): Payer: Self-pay

## 2021-11-19 ENCOUNTER — Telehealth: Payer: Self-pay

## 2021-11-19 MED ORDER — BUDESONIDE-FORMOTEROL FUMARATE 160-4.5 MCG/ACT IN AERO
2.0000 | INHALATION_SPRAY | Freq: Two times a day (BID) | RESPIRATORY_TRACT | 1 refills | Status: DC
  Filled 2021-11-19: qty 10.2, 30d supply, fill #0

## 2021-11-19 NOTE — Telephone Encounter (Signed)
Called patient - DOB verified - advised of provider notation. Patient asked what if she has been doing more puffs than directed - advised to make sure she follows provider instructions/directions to ensure patient safety ( adverse reactions:  Nasopharyngitis, pharyngolaryngeal pain, sinusitis, nasal congestion, oral candidiasis, headache, upper respiratory infection, flu, back pain, stomach discomfort, vomiting; hypersensitivity reactions. COPD: also bronchitis)  Advised if she begins having any of these reactions to seek medical attention immediately.  Patient verbalized understanding  - she stated she was just trying to open up her airways.   Symbicort 160/4.5 was electronically sent to pharmacy.

## 2021-11-19 NOTE — Telephone Encounter (Signed)
Patient called in requesting Prednisone or/and Symbicort due to wheezing and shortness of breath that started 3-4 days ago. Patient denies fever. Patient states she doesn't want to go to ER for further evaluation and treatment - she feels she's just going to wait for hours. Patient advised provider is at [facility] location on Thursday for ov/same day visit - patient stated she would like medication called in instead - advised provider will advise steps to be taken.   Patient verbalized understanding, no further questions at this time.  Pharmacy, medication list verified.  Please advise.

## 2021-11-20 ENCOUNTER — Other Ambulatory Visit (HOSPITAL_COMMUNITY): Payer: Self-pay

## 2021-11-20 MED ORDER — PREDNISONE 20 MG PO TABS
20.0000 mg | ORAL_TABLET | Freq: Two times a day (BID) | ORAL | 0 refills | Status: DC
  Filled 2021-11-20: qty 10, 5d supply, fill #0

## 2021-11-25 ENCOUNTER — Other Ambulatory Visit (HOSPITAL_COMMUNITY): Payer: Self-pay

## 2021-11-25 MED ORDER — HYDROCODONE BIT-HOMATROP MBR 5-1.5 MG/5ML PO SOLN
ORAL | 0 refills | Status: DC
  Filled 2021-11-25: qty 100, 5d supply, fill #0

## 2021-11-25 MED ORDER — PREDNISONE 20 MG PO TABS
ORAL_TABLET | ORAL | 0 refills | Status: DC
  Filled 2021-11-25: qty 10, 5d supply, fill #0

## 2021-11-27 ENCOUNTER — Telehealth: Payer: Self-pay | Admitting: Allergy

## 2021-11-27 NOTE — Telephone Encounter (Signed)
I assume her Flovent is swallowed and use to treat her eosinophilic esophagitis.  It can certainly help with inflammation in the esophagus, but I am unclear what "full body inflammation" means.  She having a rash over her body, swelling over her body, etc.?  Regarding pregnancy, the safest steroid to use during pregnancy is budesonide.  However, I would err on the side of continuing what ever she has been clinically stable on during her pregnancy.  Therefore, I would just stay on the Flovent.  I had pregnant patients on Flovent and they have been fine.  Malachi Bonds, MD [facility]

## 2021-11-27 NOTE — Telephone Encounter (Signed)
Called and left a voicemail asking for patient to return call to discuss.  °

## 2021-11-27 NOTE — Telephone Encounter (Signed)
Dr. Delorse Lek is out of the office until next week.  Matisha called in and states she has EOE and has full body inflammation.  Aubreanna is currently experiencing inflammation and states Dr. Delorse Lek has prescribed her Flovent and sshe is wondering if flovent will help with the inflammation?  Sholanda states she is starting IVF in January and wants to make sure the Flovent is safe to take during IVF and during pregnancy.  Please advise.

## 2021-12-01 NOTE — Telephone Encounter (Signed)
Spoke with patient, informed her of Dr. Ellouise Newer recommendation. Patient verbalized understanding. Patient stated that her doctor informed her that IVF will not be successful if she has inflammation in her body. Patient is wanting her IVF to be successful and wants to be sure she isn't having inflammation in her body. She stated that she will continue with her Symbicort for now however if the Flovent will help her EOE inflammation more than she may want to start it. Please advise.

## 2021-12-01 NOTE — Telephone Encounter (Signed)
Called and left a voicemail asking for patient to return call to discuss.  °

## 2021-12-02 NOTE — Telephone Encounter (Signed)
She is not having any swallowing issues at this time. Patient was informed that her IVF would not be successful if she had inflammation and she knows that her EOE can cause inflammation. She is just wanting to do everything she can for her IVF to be successful. I did informed patient that her EOE would not cause full body inflammation. Patient verbalized understanding.

## 2021-12-09 ENCOUNTER — Ambulatory Visit (INDEPENDENT_AMBULATORY_CARE_PROVIDER_SITE_OTHER): Payer: No Typology Code available for payment source | Admitting: *Deleted

## 2021-12-09 DIAGNOSIS — J309 Allergic rhinitis, unspecified: Secondary | ICD-10-CM

## 2021-12-12 ENCOUNTER — Other Ambulatory Visit (HOSPITAL_COMMUNITY): Payer: Self-pay

## 2021-12-12 MED ORDER — DOXYCYCLINE HYCLATE 100 MG PO CAPS
ORAL_CAPSULE | ORAL | 0 refills | Status: DC
  Filled 2021-12-12: qty 10, 5d supply, fill #0

## 2021-12-15 ENCOUNTER — Other Ambulatory Visit (HOSPITAL_COMMUNITY): Payer: Self-pay

## 2021-12-15 MED ORDER — FOLLITROPIN ALFA 900 UNIT/1.5ML ~~LOC~~ SOPN
PEN_INJECTOR | SUBCUTANEOUS | 1 refills | Status: DC
  Filled 2021-12-15: qty 4.5, 3d supply, fill #0

## 2021-12-15 MED ORDER — "NEEDLE (DISP) 30G X 1/2"" MISC"
2 refills | Status: DC
  Filled 2021-12-15: qty 30, 30d supply, fill #0

## 2021-12-15 MED ORDER — "BD LUER-LOK SYRINGE 18G X 1-1/2"" 3 ML MISC"
2 refills | Status: DC
  Filled 2021-12-15: qty 60, 30d supply, fill #0

## 2021-12-15 MED ORDER — MENOTROPINS 75 UNITS ~~LOC~~ SOLR
75.0000 [IU] | Freq: Every day | SUBCUTANEOUS | 1 refills | Status: DC
  Filled 2021-12-15: qty 10, 10d supply, fill #0

## 2021-12-15 MED ORDER — CHORIONIC GONADOTROPIN 5000 UNITS IM SOLR
INTRAMUSCULAR | 1 refills | Status: DC
  Filled 2021-12-15: qty 2, 10d supply, fill #0

## 2021-12-15 MED ORDER — GONAL-F RFF REDIJECT 900 UNIT/1.5ML ~~LOC~~ SOPN
PEN_INJECTOR | SUBCUTANEOUS | 1 refills | Status: DC
  Filled 2021-12-15: qty 4.5, 15d supply, fill #0

## 2021-12-15 MED ORDER — PROGESTERONE 50 MG/ML IM OIL
TOPICAL_OIL | INTRAMUSCULAR | 2 refills | Status: DC
  Filled 2021-12-15: qty 30, 30d supply, fill #0

## 2021-12-15 MED ORDER — "NEEDLE (DISP) 22G X 1-1/2"" MISC"
2 refills | Status: DC
  Filled 2021-12-15 – 2021-12-16 (×3): qty 30, 30d supply, fill #0
  Filled 2022-03-02: qty 30, 15d supply, fill #0

## 2021-12-15 MED ORDER — CETRORELIX ACETATE 0.25 MG ~~LOC~~ KIT
PACK | SUBCUTANEOUS | 1 refills | Status: DC
  Filled 2021-12-15: qty 5, 5d supply, fill #0

## 2021-12-15 MED ORDER — ESTRADIOL 2 MG PO TABS
2.0000 mg | ORAL_TABLET | Freq: Two times a day (BID) | ORAL | 3 refills | Status: DC
  Filled 2021-12-15 – 2022-02-16 (×2): qty 60, 30d supply, fill #0
  Filled 2022-03-20: qty 60, 30d supply, fill #1

## 2021-12-15 MED ORDER — CETRORELIX ACETATE 0.25 MG ~~LOC~~ KIT
PACK | SUBCUTANEOUS | 1 refills | Status: DC
  Filled 2021-12-15: qty 5, 30d supply, fill #0

## 2021-12-15 MED ORDER — DOXYCYCLINE HYCLATE 100 MG PO TABS
ORAL_TABLET | ORAL | 0 refills | Status: DC
  Filled 2021-12-15: qty 40, 20d supply, fill #0

## 2021-12-15 MED ORDER — LEUPROLIDE ACETATE 1 MG/0.2ML IJ KIT
PACK | INTRAMUSCULAR | 1 refills | Status: DC
  Filled 2021-12-15: qty 1, 10d supply, fill #0

## 2021-12-15 MED ORDER — METHYLPREDNISOLONE 8 MG PO TABS
8.0000 mg | ORAL_TABLET | Freq: Two times a day (BID) | ORAL | 0 refills | Status: DC
  Filled 2021-12-15: qty 8, 4d supply, fill #0

## 2021-12-15 MED ORDER — "BD DISP NEEDLES 30G X 1/2"" MISC"
2 refills | Status: DC
  Filled 2021-12-15: qty 30, 15d supply, fill #0

## 2021-12-15 MED ORDER — BUSPIRONE HCL 10 MG PO TABS
ORAL_TABLET | ORAL | 1 refills | Status: DC
  Filled 2021-12-15: qty 270, 90d supply, fill #0

## 2021-12-15 MED ORDER — ESTRADIOL 2 MG PO TABS
ORAL_TABLET | ORAL | 3 refills | Status: DC
  Filled 2021-12-15: qty 60, 30d supply, fill #0

## 2021-12-15 MED ORDER — ESTRADIOL 0.1 MG/24HR TD PTTW
MEDICATED_PATCH | TRANSDERMAL | 4 refills | Status: DC
  Filled 2021-12-15: qty 8, 24d supply, fill #0

## 2021-12-15 MED ORDER — LEUPROLIDE ACETATE 1 MG/0.2ML IJ KIT
PACK | INTRAMUSCULAR | 1 refills | Status: DC
  Filled 2021-12-15: qty 1, 1d supply, fill #0
  Filled 2021-12-16: qty 1, 30d supply, fill #0
  Filled 2021-12-17: qty 1, 1d supply, fill #0

## 2021-12-15 MED ORDER — METHYLPREDNISOLONE 8 MG PO TABS
ORAL_TABLET | ORAL | 0 refills | Status: DC
  Filled 2021-12-15: qty 8, 4d supply, fill #0

## 2021-12-15 MED ORDER — DOXYCYCLINE HYCLATE 100 MG PO TABS
100.0000 mg | ORAL_TABLET | Freq: Two times a day (BID) | ORAL | 0 refills | Status: DC
  Filled 2021-12-15: qty 40, 20d supply, fill #0

## 2021-12-15 MED ORDER — CHORIONIC GONADOTROPIN 5000 UNITS IM SOLR
INTRAMUSCULAR | 1 refills | Status: DC
  Filled 2021-12-15: qty 2, 1d supply, fill #0

## 2021-12-15 MED ORDER — ESTRADIOL 0.1 MG/24HR TD PTTW
1.0000 | MEDICATED_PATCH | TRANSDERMAL | 4 refills | Status: DC
Start: 2021-12-12 — End: 2022-09-04
  Filled 2021-12-15: qty 8, 28d supply, fill #0
  Filled 2022-02-04: qty 8, 28d supply, fill #1
  Filled 2022-02-05: qty 8, 28d supply, fill #0
  Filled 2022-03-02: qty 8, 28d supply, fill #1
  Filled 2022-03-27: qty 8, 28d supply, fill #2

## 2021-12-15 MED ORDER — DIAZEPAM 5 MG PO TABS
ORAL_TABLET | ORAL | 0 refills | Status: DC
  Filled 2021-12-15: qty 5, 30d supply, fill #0

## 2021-12-15 MED ORDER — MENOPUR 75 UNITS ~~LOC~~ SOLR: SUBCUTANEOUS | 1 refills | Status: DC

## 2021-12-15 MED ORDER — HYDROXYZINE PAMOATE 25 MG PO CAPS
ORAL_CAPSULE | ORAL | 1 refills | Status: DC
  Filled 2021-12-15: qty 60, 15d supply, fill #0

## 2021-12-15 MED ORDER — "SYRINGE/NEEDLE (DISP) 18G X 1-1/2"" 3 ML MISC"
2 refills | Status: DC
  Filled 2021-12-15: qty 60, 30d supply, fill #0

## 2021-12-15 MED ORDER — "EASY TOUCH HYPODERMIC NEEDLE 22G X 1-1/2"" MISC"
2 refills | Status: DC
  Filled 2021-12-15: qty 30, 15d supply, fill #0

## 2021-12-16 ENCOUNTER — Other Ambulatory Visit (HOSPITAL_COMMUNITY): Payer: Self-pay

## 2021-12-17 ENCOUNTER — Other Ambulatory Visit (HOSPITAL_COMMUNITY): Payer: Self-pay

## 2021-12-18 ENCOUNTER — Ambulatory Visit (INDEPENDENT_AMBULATORY_CARE_PROVIDER_SITE_OTHER): Payer: No Typology Code available for payment source | Admitting: *Deleted

## 2021-12-18 DIAGNOSIS — J309 Allergic rhinitis, unspecified: Secondary | ICD-10-CM

## 2021-12-19 ENCOUNTER — Other Ambulatory Visit (HOSPITAL_COMMUNITY): Payer: Self-pay

## 2021-12-23 ENCOUNTER — Other Ambulatory Visit (HOSPITAL_COMMUNITY): Payer: Self-pay

## 2021-12-23 ENCOUNTER — Ambulatory Visit (INDEPENDENT_AMBULATORY_CARE_PROVIDER_SITE_OTHER): Payer: No Typology Code available for payment source

## 2021-12-23 DIAGNOSIS — J309 Allergic rhinitis, unspecified: Secondary | ICD-10-CM | POA: Diagnosis not present

## 2021-12-24 ENCOUNTER — Other Ambulatory Visit (HOSPITAL_COMMUNITY): Payer: Self-pay

## 2021-12-29 ENCOUNTER — Ambulatory Visit (INDEPENDENT_AMBULATORY_CARE_PROVIDER_SITE_OTHER): Payer: No Typology Code available for payment source

## 2021-12-29 ENCOUNTER — Other Ambulatory Visit (HOSPITAL_COMMUNITY): Payer: Self-pay

## 2021-12-29 DIAGNOSIS — J309 Allergic rhinitis, unspecified: Secondary | ICD-10-CM | POA: Diagnosis not present

## 2021-12-29 MED ORDER — HYDROXYZINE HCL 10 MG PO TABS
ORAL_TABLET | ORAL | 1 refills | Status: DC
  Filled 2021-12-29: qty 120, 15d supply, fill #0

## 2021-12-30 ENCOUNTER — Other Ambulatory Visit (HOSPITAL_COMMUNITY): Payer: Self-pay

## 2022-01-06 ENCOUNTER — Ambulatory Visit (INDEPENDENT_AMBULATORY_CARE_PROVIDER_SITE_OTHER): Payer: No Typology Code available for payment source | Admitting: *Deleted

## 2022-01-06 DIAGNOSIS — J309 Allergic rhinitis, unspecified: Secondary | ICD-10-CM | POA: Diagnosis not present

## 2022-01-09 ENCOUNTER — Other Ambulatory Visit (HOSPITAL_COMMUNITY): Payer: Self-pay

## 2022-01-15 ENCOUNTER — Other Ambulatory Visit (HOSPITAL_COMMUNITY): Payer: Self-pay

## 2022-01-15 MED ORDER — NORETHINDRONE ACETATE 5 MG PO TABS
ORAL_TABLET | ORAL | 0 refills | Status: DC
  Filled 2022-01-15: qty 30, 30d supply, fill #0

## 2022-01-20 ENCOUNTER — Other Ambulatory Visit (HOSPITAL_COMMUNITY): Payer: Self-pay

## 2022-01-28 ENCOUNTER — Other Ambulatory Visit (HOSPITAL_COMMUNITY): Payer: Self-pay

## 2022-02-03 ENCOUNTER — Ambulatory Visit (INDEPENDENT_AMBULATORY_CARE_PROVIDER_SITE_OTHER): Payer: No Typology Code available for payment source

## 2022-02-03 ENCOUNTER — Other Ambulatory Visit (HOSPITAL_COMMUNITY): Payer: Self-pay

## 2022-02-03 DIAGNOSIS — J309 Allergic rhinitis, unspecified: Secondary | ICD-10-CM | POA: Diagnosis not present

## 2022-02-04 ENCOUNTER — Other Ambulatory Visit (HOSPITAL_COMMUNITY): Payer: Self-pay

## 2022-02-05 ENCOUNTER — Other Ambulatory Visit (HOSPITAL_COMMUNITY): Payer: Self-pay

## 2022-02-06 ENCOUNTER — Other Ambulatory Visit (HOSPITAL_COMMUNITY): Payer: Self-pay

## 2022-02-06 MED ORDER — NEUPOGEN 300 MCG/0.5ML IJ SOSY
PREFILLED_SYRINGE | INTRAMUSCULAR | 1 refills | Status: DC
  Filled 2022-02-06: qty 1, 30d supply, fill #0

## 2022-02-06 MED ORDER — ZARXIO 300 MCG/0.5ML IJ SOSY
PREFILLED_SYRINGE | INTRAMUSCULAR | 0 refills | Status: DC
  Filled 2022-02-06: qty 0.5, 30d supply, fill #0

## 2022-02-07 ENCOUNTER — Other Ambulatory Visit (HOSPITAL_COMMUNITY): Payer: Self-pay

## 2022-02-07 MED ORDER — NEUPOGEN 300 MCG/0.5ML IJ SOSY
PREFILLED_SYRINGE | INTRAMUSCULAR | 1 refills | Status: DC
  Filled 2022-02-07: qty 0.5, 30d supply, fill #0

## 2022-02-09 ENCOUNTER — Other Ambulatory Visit (HOSPITAL_COMMUNITY): Payer: Self-pay

## 2022-02-10 ENCOUNTER — Other Ambulatory Visit (HOSPITAL_COMMUNITY): Payer: Self-pay

## 2022-02-16 ENCOUNTER — Other Ambulatory Visit (HOSPITAL_COMMUNITY): Payer: Self-pay

## 2022-02-18 ENCOUNTER — Other Ambulatory Visit (HOSPITAL_COMMUNITY): Payer: Self-pay

## 2022-02-19 ENCOUNTER — Other Ambulatory Visit (HOSPITAL_COMMUNITY): Payer: Self-pay

## 2022-02-19 MED ORDER — FOLIC ACID 1 MG PO TABS
ORAL_TABLET | ORAL | 4 refills | Status: DC
  Filled 2022-02-19 (×2): qty 120, 30d supply, fill #0

## 2022-03-02 ENCOUNTER — Telehealth: Payer: Self-pay

## 2022-03-02 ENCOUNTER — Other Ambulatory Visit (HOSPITAL_COMMUNITY): Payer: Self-pay

## 2022-03-02 ENCOUNTER — Ambulatory Visit (INDEPENDENT_AMBULATORY_CARE_PROVIDER_SITE_OTHER): Payer: No Typology Code available for payment source

## 2022-03-02 DIAGNOSIS — J309 Allergic rhinitis, unspecified: Secondary | ICD-10-CM | POA: Diagnosis not present

## 2022-03-02 NOTE — Telephone Encounter (Signed)
Patient was informed to let us know when all her test come back positive.  ?

## 2022-03-02 NOTE — Telephone Encounter (Signed)
Patient has been informed that she will continue on maintenance dose. Patient verbalized understanding and stated that she believed Dr. Nelva Bush informed of that but she wanted to be sure.  ?

## 2022-03-02 NOTE — Telephone Encounter (Signed)
Pregnant patients can continue their current dose of allergy injections.  ? ?If she wants to discuss further then make appointment with Dr. Delorse Lek.  ?

## 2022-03-02 NOTE — Telephone Encounter (Signed)
Patient called and has been doing IVF and has a positive beta test. Patient is also having an ultra sound down in a week and a half. Patient is wanting to come in for her allergy injections. Patient is at 0.5 ml every 4 weeks. Is it safe to continue 0.5 ml every 4 weeks. Dr. Selena Batten I am sending to you as Dr. Delorse Lek is off today. Please advise.  ?

## 2022-03-10 ENCOUNTER — Other Ambulatory Visit (HOSPITAL_COMMUNITY): Payer: Self-pay

## 2022-03-19 ENCOUNTER — Encounter: Payer: Self-pay | Admitting: Allergy

## 2022-03-19 ENCOUNTER — Other Ambulatory Visit (HOSPITAL_COMMUNITY): Payer: Self-pay

## 2022-03-19 ENCOUNTER — Ambulatory Visit (INDEPENDENT_AMBULATORY_CARE_PROVIDER_SITE_OTHER): Payer: No Typology Code available for payment source | Admitting: Allergy

## 2022-03-19 VITALS — BP 128/78 | HR 82 | Temp 98.4°F | Resp 18 | Ht 63.0 in | Wt 148.0 lb

## 2022-03-19 DIAGNOSIS — J4520 Mild intermittent asthma, uncomplicated: Secondary | ICD-10-CM | POA: Diagnosis not present

## 2022-03-19 DIAGNOSIS — K200 Eosinophilic esophagitis: Secondary | ICD-10-CM

## 2022-03-19 DIAGNOSIS — J3089 Other allergic rhinitis: Secondary | ICD-10-CM | POA: Diagnosis not present

## 2022-03-19 DIAGNOSIS — H1013 Acute atopic conjunctivitis, bilateral: Secondary | ICD-10-CM | POA: Diagnosis not present

## 2022-03-19 MED ORDER — EPINEPHRINE 0.3 MG/0.3ML IJ SOAJ
INTRAMUSCULAR | 2 refills | Status: AC
  Filled 2022-03-19: qty 2, 30d supply, fill #0
  Filled 2023-02-09: qty 2, 30d supply, fill #1

## 2022-03-19 MED ORDER — BUDESONIDE-FORMOTEROL FUMARATE 160-4.5 MCG/ACT IN AERO
2.0000 | INHALATION_SPRAY | Freq: Two times a day (BID) | RESPIRATORY_TRACT | 1 refills | Status: DC
  Filled 2022-03-19 – 2022-04-30 (×2): qty 10.2, 30d supply, fill #0

## 2022-03-19 MED ORDER — OLOPATADINE HCL 0.2 % OP SOLN
OPHTHALMIC | 5 refills | Status: DC
  Filled 2022-03-19: qty 2.5, 25d supply, fill #0

## 2022-03-19 MED ORDER — LEVOCETIRIZINE DIHYDROCHLORIDE 5 MG PO TABS
5.0000 mg | ORAL_TABLET | Freq: Every day | ORAL | 11 refills | Status: DC | PRN
  Filled 2022-03-19: qty 60, 30d supply, fill #0
  Filled 2022-04-30: qty 180, 90d supply, fill #1

## 2022-03-19 NOTE — Progress Notes (Signed)
? ? ?Follow-up Note ? ?RE: Patricia Harvey MRN: [id number] DOB: Jan 27, 1991 ?Date of Office Visit: 03/19/2022 ? ? ?History of present illness: ?Patricia Harvey is a 31 y.o. female presenting today for follow-up of allergic rhinitis with conjunctivitis, EOE and reactive airway.  She was last seen in the office on 11/06/2021 by myself. ?She is currently [redacted] weeks pregnant!  She states she has home nausea thus far.    ?She states she is happy she continued on with immunotherapy as she is noting improvement in her itchy eyes especially during this time of year.  She still has itchy eyes however and she would like olopatadine drops sent into her pharmacy.  She will use Xyzal as her antihistamine of choice.  With regards to immunotherapy she is at monthly maintenance dosing. ?She does do dietary modification to help control her EOE symptoms.  When her last EGD her eosinophils were less than 15 per high-power field. ?In regards to reactive airway she has had episodes in the past mostly surrounding illnesses where she has had issues with cough and wheeze.  We have used Symbicort with these illnesses in the past that has been helpful.  She does have access to an albuterol inhaler. ? ?Review of systems: ?Review of Systems  ?Constitutional: Negative.   ?HENT: Negative.    ?Eyes:  Positive for itching.  ?Respiratory: Negative.    ?Cardiovascular: Negative.   ?Gastrointestinal: Negative.   ?Musculoskeletal: Negative.   ?Skin: Negative.   ?Allergic/Immunologic: Negative.   ?Neurological: Negative.    ? ?All other systems negative unless noted above in HPI ? ?Past medical/social/surgical/family history have been reviewed and are unchanged unless specifically indicated below. ? ?No changes ? ?Medication List: ?Current Outpatient Medications  ?Medication Sig Dispense Refill  ? albuterol (PROVENTIL) (2.5 MG/3ML) 0.083% nebulizer solution USE 1 VIAL VIA NEBULIZER EVERY 4 TO 6 HOURS AS NEEDED FOR WHEEZING 75 mL 5  ? albuterol (VENTOLIN HFA) 108  (90 Base) MCG/ACT inhaler Inhale 2 puffs into the lungs every 4 (four) hours as needed for wheezing or shortness of breath. 18 g 2  ? azelastine (ASTELIN) 0.1 % nasal spray Place into the nose.    ? Bromelains 500 MG TABS Take by mouth.    ? budesonide-formoterol (SYMBICORT) 160-4.5 MCG/ACT inhaler Inhale 2 puffs into the lungs 2 (two) times daily. 10.2 g 1  ? busPIRone (BUSPAR) 10 MG tablet Take 5-10 mg by mouth 3 (three) times daily.    ? busPIRone (BUSPAR) 10 MG tablet Take 1/2 to 1 tablet by mouth three times daily with food. 270 tablet 1  ? busPIRone (BUSPAR) 10 MG tablet Take 1/2 to 1 tablet by mouth 3 times daily with food. 270 tablet 1  ? Cetrorelix Acetate (CETROTIDE) 0.25 MG KIT Reconstitute and inject 1 syringe daily or as directed. 5 kit 1  ? Cetrorelix Acetate (CETROTIDE) 0.25 MG KIT Reconstitute and inject 1 syringe daily as directed 5 kit 1  ? Chorionic Gonadotropin 5000 units SOLR Mix with diluent and inject at exact time given as directed. 2 each 1  ? Chorionic Gonadotropin 5000 units SOLR Mix 1 ml diluent and inject at exact time given as directed 2 each 1  ? diazepam (VALIUM) 5 MG tablet Take one tablet 30 minutes prior to procedure 3 tablet 0  ? diazepam (VALIUM) 5 MG tablet Take 1/2 to 1 tablet by mouth 30 minutes prior to procedures. 5 tablet 0  ? doxycycline (VIBRA-TABS) 100 MG tablet Take 1 tablet (100  mg total) by mouth 2 (two) times daily. 40 tablet 0  ? doxycycline (VIBRA-TABS) 100 MG tablet Take 1 tablet by mouth twice a day 40 tablet 0  ? doxycycline (VIBRAMYCIN) 100 MG capsule Take 1 capsule by mouth twice daily. 10 capsule 0  ? EPINEPHrine 0.3 mg/0.3 mL IJ SOAJ injection INJECT 0.3 MG INTO THE MUSCLE ONCE FOR 1 DOSE AS NEEDED FOR LIFE THREATENING ALLERGIC REACTION. 6 each 0  ? estradiol (ESTRACE) 2 MG tablet Take 1 tablet (2 mg total) by mouth 2 (two) times daily. 60 tablet 3  ? estradiol (ESTRACE) 2 MG tablet Take 1 tablet by mouth twice a day 60 tablet 3  ? estradiol (VIVELLE-DOT)  0.1 MG/24HR patch Place 1 patch onto the skin every 3 days. 8 patch 4  ? estradiol (VIVELLE-DOT) 0.1 MG/24HR patch Place 1 patch onto the skin every 3 days 8 patch 4  ? filgrastim (NEUPOGEN) 300 MCG/0.5ML SOSY injection Inject 1 prefille syringe 1 hour prior to procedure 1 mL 1  ? fluticasone (FLONASE) 50 MCG/ACT nasal spray 2 sprays by Each Nare route daily.    ? folic acid (FOLVITE) 1 MG tablet Take 1 mg by mouth daily.    ? folic acid (FOLVITE) 1 MG tablet Take 1 tablet by mouth 4 times a day 120 tablet 4  ? Follitropin Alfa (GONAL-F RFF REDIJECT) 900 UNIT/1.5ML SOPN Inject 262 units into the skin daily or as directed 4.5 mL 1  ? Follitropin Alfa 900 UNIT/1.5ML SOPN Inject 262 units into the skin daily or as directed 4.5 mL 1  ? HYDROcodone bit-homatropine (HYCODAN) 5-1.5 MG/5ML syrup Take 5 MLs by mouth every 6 hours as needed for cough for 5 days 100 mL 0  ? hydrOXYzine (ATARAX) 10 MG tablet Take 1 to 2 tablets by mouth every 6 hours as needed 120 tablet 1  ? hydrOXYzine (VISTARIL) 25 MG capsule Take 1 capsule by mouth every 6 hours as needed. 60 capsule 1  ? lamoTRIgine (LAMICTAL) 100 MG tablet Take 100 mg by mouth daily. Take one tablet daily    ? lamoTRIgine (LAMICTAL) 25 MG tablet Take 25 mg by mouth every morning. Take one tablet daily    ? leuprolide (LUPRON) 1 MG/0.2ML injection Inject 80 units into the skin as directed 1 kit 1  ? leuprolide (LUPRON) 1 MG/0.2ML injection Inject 80 units as a one time dose as directed 1 kit 1  ? levocetirizine (XYZAL) 5 MG tablet Take 1 tablet by mouth up to 2 (two) times daily as needed for allergies. May take extra dose if needed for flares. 60 tablet 11  ? Menotropins (MENOPUR) 75 units SOLR Inject 75 units into the skin daily or as directed 10 each 1  ? Menotropins 75 units SOLR Inject 75 Units into the skin daily or as directed. 10 each 1  ? methylphenidate (RITALIN) 5 MG tablet Take 1 tablet by mouth on an empty stomach once daily 30 tablet 0  ? methylPREDNISolone  (MEDROL) 8 MG tablet Take 1 tablet (8 mg total) by mouth 2 (two) times daily. 8 tablet 0  ? methylPREDNISolone (MEDROL) 8 MG tablet Take 1 tablet by mouth twice a day 8 tablet 0  ? montelukast (SINGULAIR) 10 MG tablet Take 1 tablet (10 mg total) by mouth at bedtime. 30 tablet 11  ? NEEDLE, DISP, 22 G (EASY TOUCH HYPODERMIC NEEDLE) 22G X 1-1/2" MISC Use as directed 30 each 2  ? NEEDLE, DISP, 22 G 22G X 1-1/2" MISC Use  as directed 30 each 2  ? NEEDLE, DISP, 30 G (BD DISP NEEDLES) 30G X 1/2" MISC Use as directed 30 each 2  ? NEEDLE, DISP, 30 G 30G X 1/2" MISC Use as directed 30 each 2  ? Nirmatrelvir & Ritonavir (PAXLOVID) 20 x 150 MG & 10 x $Re'100MG'zxC$  TBPK Take 3 tablets by mouth twice a day for 5 days as directed 30 each 0  ? norethindrone (AYGESTIN) 5 MG tablet Take 1 tablet by mouth daily 30 tablet 0  ? Olopatadine HCl 0.2 % SOLN Apply 1 drop to affected eye daily as needed. 2.5 mL 11  ? ondansetron (ZOFRAN) 4 MG tablet Take 4 mg by mouth daily.    ? SYRINGE-NEEDLE, DISP, 3 ML (B-D 3CC LUER-LOK SYR 18GX1-1/2) 18G X 1-1/2" 3 ML MISC Use as directed 60 each 2  ? SYRINGE-NEEDLE, DISP, 3 ML 18G X 1-1/2" 3 ML MISC Use as directed 60 each 2  ? TURMERIC PO Take 1 tablet by mouth daily.    ? ipratropium-albuterol (DUONEB) 0.5-2.5 (3) MG/3ML SOLN INHALE 3 ML BY NEBULIZATION EVERY 6 HOURS AS NEEDED 90 mL 1  ? ?No current facility-administered medications for this visit.  ?  ? ?Known medication allergies: ?Allergies  ?Allergen Reactions  ? Grass Extracts [Gramineae Pollens] Itching  ? Msud Aid [Alitraq] Diarrhea  ? Other Shortness Of Breath and Other (See Comments)  ?  Steroids, panic attack  ? Sesame Oil Hives, Itching and Anaphylaxis  ? Sesame Seed (Diagnostic) Shortness Of Breath, Diarrhea, Nausea Only and Other (See Comments)  ?  cramping  ? Amphetamine-Dextroamphetamine Other (See Comments)  ?  depression  ? Methylphenidate Other (See Comments) and Nausea Only  ?  anxiety/frustrated  ? Milk-Related Compounds Diarrhea and  Nausea And Vomiting  ?  Cramping & bloating also  ? Prazosin   ?  Syncope  ? Wheat Bran Nausea Only and Other (See Comments)  ?  Cramping, lower GI pain, nausea, constipation  ? ? ? ?Physical examination: ?Bl

## 2022-03-19 NOTE — Patient Instructions (Addendum)
Allergic rhinitis with conjunctivitis ?  - symptoms improved on immunotherapy - continue. We have discussed when you start next new vial will remain at 0.11ml until delivery ?  - Continue avoidance measures ?  - Continue xyzal daily as needed.   ?  - Continue Pataday (olopatadine) eye drops daily as needed for itchy/watery eyes ?   ?EoE ? - esophageal eosinophils are <15/hpf ? - continue current dietary avoidance for management of GI symptoms ? ?Reactive airway ?  - have access to albuterol inhaler 2 puffs or 1 vial via nebulizer every 4-6 hours as needed for cough/wheeze/shortness of breath/chest tightness.  May use 15-20 minutes prior to activity.   Monitor frequency of use.   ?   -use Symbicort 2 puffs twice a day during respiratory illnesses of flares and can use as needed (do not exceed 10-12 puffs per day) for cough/wheeze/shortness of breath/chest tightness. ? ?Follow-up 3-4 months for follow-up or sooner if needed ? ? ? ?

## 2022-03-20 ENCOUNTER — Other Ambulatory Visit (HOSPITAL_COMMUNITY): Payer: Self-pay

## 2022-03-21 ENCOUNTER — Other Ambulatory Visit (HOSPITAL_COMMUNITY): Payer: Self-pay

## 2022-03-21 MED ORDER — ONDANSETRON 4 MG PO TBDP
4.0000 mg | ORAL_TABLET | Freq: Four times a day (QID) | ORAL | 1 refills | Status: DC
  Filled 2022-03-21: qty 30, 8d supply, fill #0

## 2022-03-27 ENCOUNTER — Other Ambulatory Visit (HOSPITAL_COMMUNITY): Payer: Self-pay

## 2022-03-30 ENCOUNTER — Ambulatory Visit (INDEPENDENT_AMBULATORY_CARE_PROVIDER_SITE_OTHER): Payer: No Typology Code available for payment source

## 2022-03-30 DIAGNOSIS — J309 Allergic rhinitis, unspecified: Secondary | ICD-10-CM | POA: Diagnosis not present

## 2022-04-06 ENCOUNTER — Other Ambulatory Visit (HOSPITAL_COMMUNITY): Payer: Self-pay

## 2022-04-06 MED ORDER — PROGESTERONE 200 MG PO CAPS
ORAL_CAPSULE | ORAL | 0 refills | Status: DC
  Filled 2022-04-06: qty 14, 14d supply, fill #0

## 2022-04-20 ENCOUNTER — Other Ambulatory Visit: Payer: Self-pay | Admitting: Obstetrics and Gynecology

## 2022-04-20 DIAGNOSIS — E079 Disorder of thyroid, unspecified: Secondary | ICD-10-CM

## 2022-04-21 ENCOUNTER — Ambulatory Visit
Admission: RE | Admit: 2022-04-21 | Discharge: 2022-04-21 | Disposition: A | Discharge status: Home / Self Care | Payer: No Typology Code available for payment source | Source: Ambulatory Visit | Attending: Obstetrics and Gynecology | Admitting: Obstetrics and Gynecology

## 2022-04-21 DIAGNOSIS — E079 Disorder of thyroid, unspecified: Secondary | ICD-10-CM

## 2022-04-30 ENCOUNTER — Other Ambulatory Visit (HOSPITAL_COMMUNITY): Payer: Self-pay

## 2022-05-04 ENCOUNTER — Other Ambulatory Visit (HOSPITAL_COMMUNITY): Payer: Self-pay

## 2022-05-04 ENCOUNTER — Ambulatory Visit (INDEPENDENT_AMBULATORY_CARE_PROVIDER_SITE_OTHER): Payer: No Typology Code available for payment source

## 2022-05-04 DIAGNOSIS — J309 Allergic rhinitis, unspecified: Secondary | ICD-10-CM

## 2022-05-04 MED ORDER — BUSPIRONE HCL 10 MG PO TABS
ORAL_TABLET | ORAL | 1 refills | Status: DC
  Filled 2022-05-04: qty 270, 90d supply, fill #0

## 2022-05-04 MED ORDER — HYDROXYZINE HCL 10 MG PO TABS
ORAL_TABLET | ORAL | 0 refills | Status: DC
  Filled 2022-05-04: qty 240, 30d supply, fill #0
  Filled 2022-05-04: qty 360, 45d supply, fill #0

## 2022-05-05 DIAGNOSIS — J3081 Allergic rhinitis due to animal (cat) (dog) hair and dander: Secondary | ICD-10-CM | POA: Diagnosis not present

## 2022-05-08 ENCOUNTER — Ambulatory Visit: Payer: No Typology Code available for payment source | Admitting: Allergy

## 2022-05-13 ENCOUNTER — Other Ambulatory Visit (HOSPITAL_COMMUNITY): Payer: Self-pay

## 2022-05-20 ENCOUNTER — Other Ambulatory Visit (HOSPITAL_COMMUNITY): Payer: Self-pay

## 2022-05-20 MED ORDER — NYSTATIN-TRIAMCINOLONE 100000-0.1 UNIT/GM-% EX OINT
TOPICAL_OINTMENT | CUTANEOUS | 0 refills | Status: DC
  Filled 2022-05-20: qty 15, 7d supply, fill #0
  Filled 2022-05-20: qty 15, 10d supply, fill #0

## 2022-05-20 MED ORDER — METRONIDAZOLE 0.75 % VA GEL
VAGINAL | 0 refills | Status: DC
  Filled 2022-05-20 (×2): qty 70, 5d supply, fill #0

## 2022-05-28 IMAGING — DX DG HAND COMPLETE 3+V*R*
3 series · 3 of 3 positions shown · non-contrast
Comparison: None.

CLINICAL DATA: Injury to the right thumb 10 days ago. MCP joint
pain.

EXAM:
RIGHT HAND - COMPLETE 3+ VIEW

[dg hand complete right (1 of 3)]
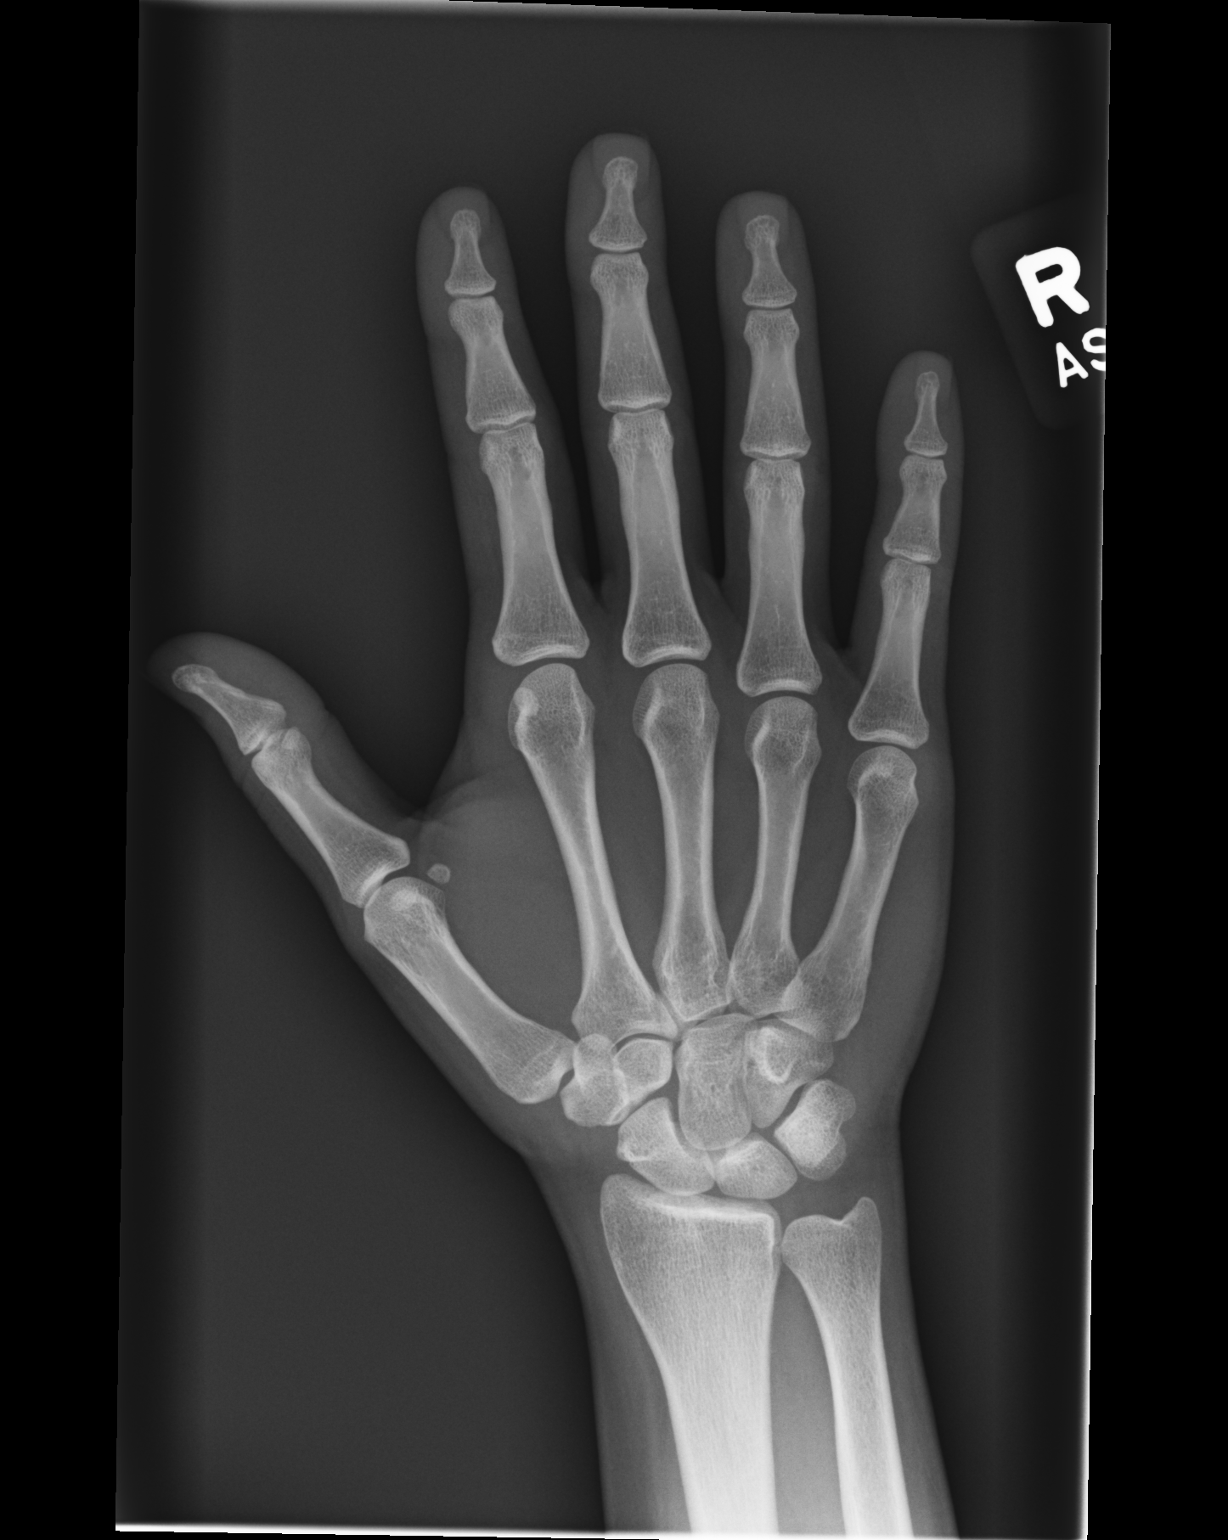

[dg hand complete right (2 of 3)]
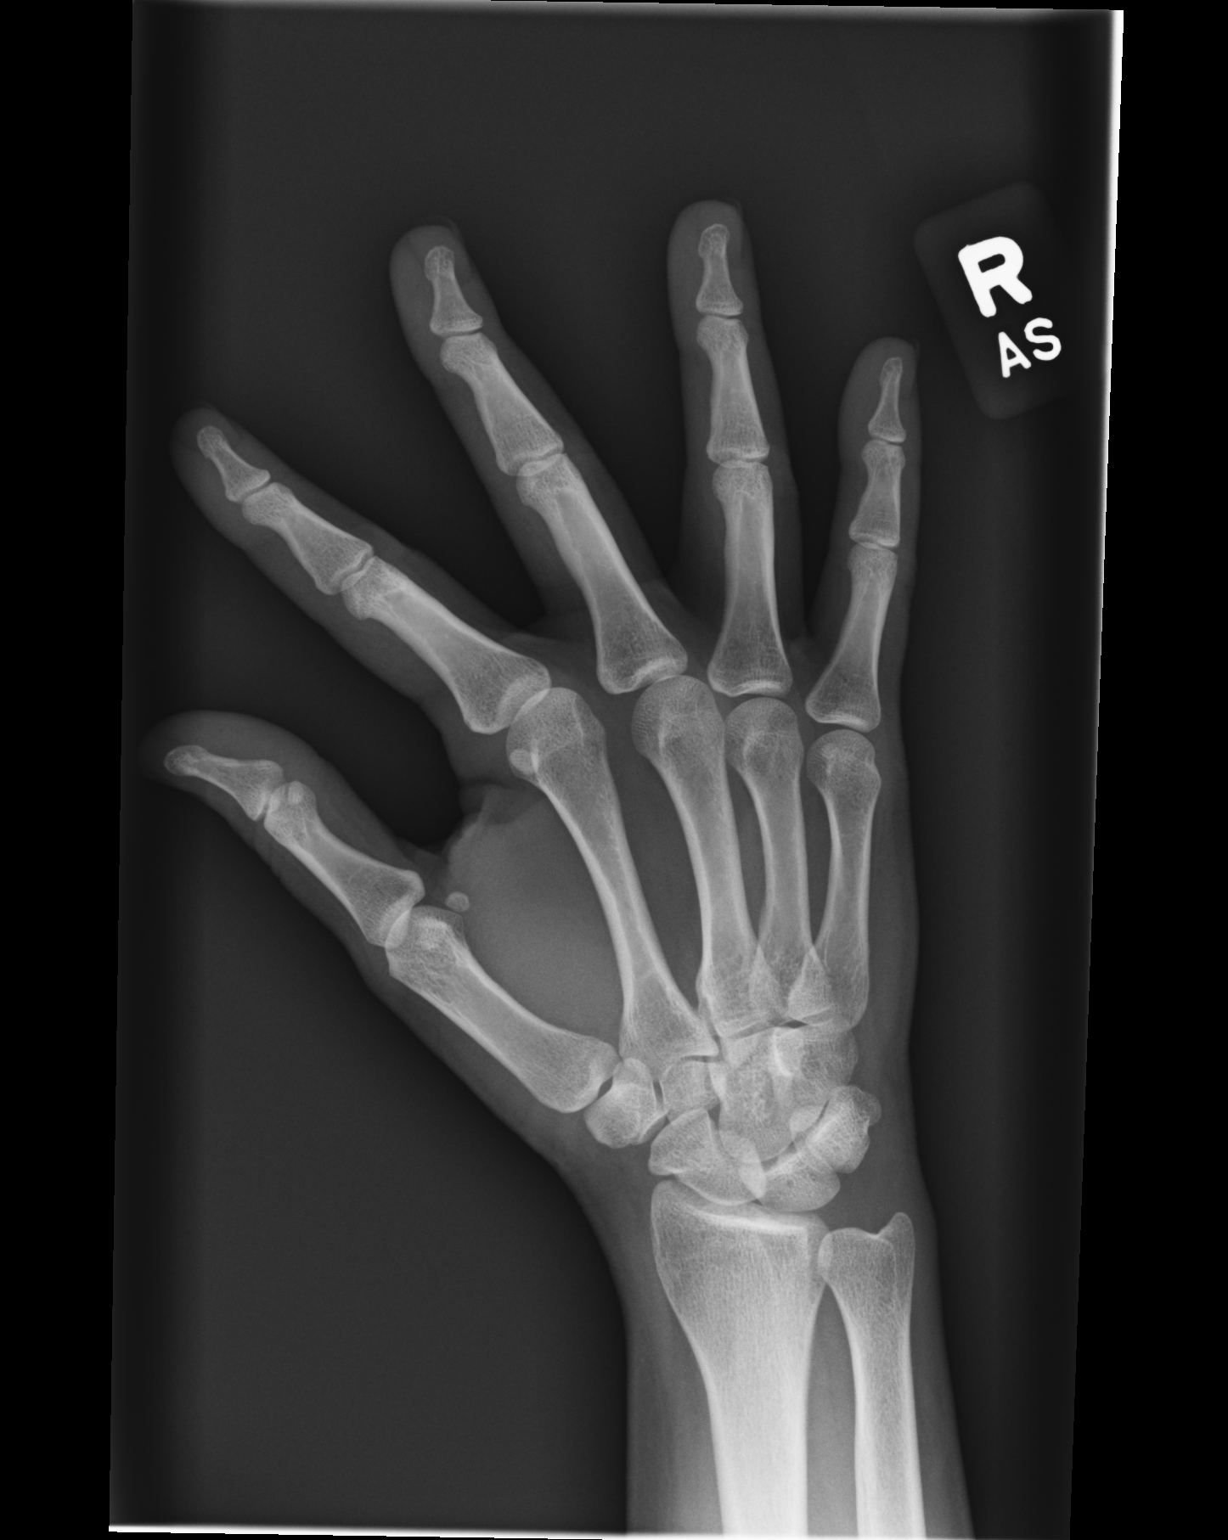

[dg hand complete right (3 of 3)]
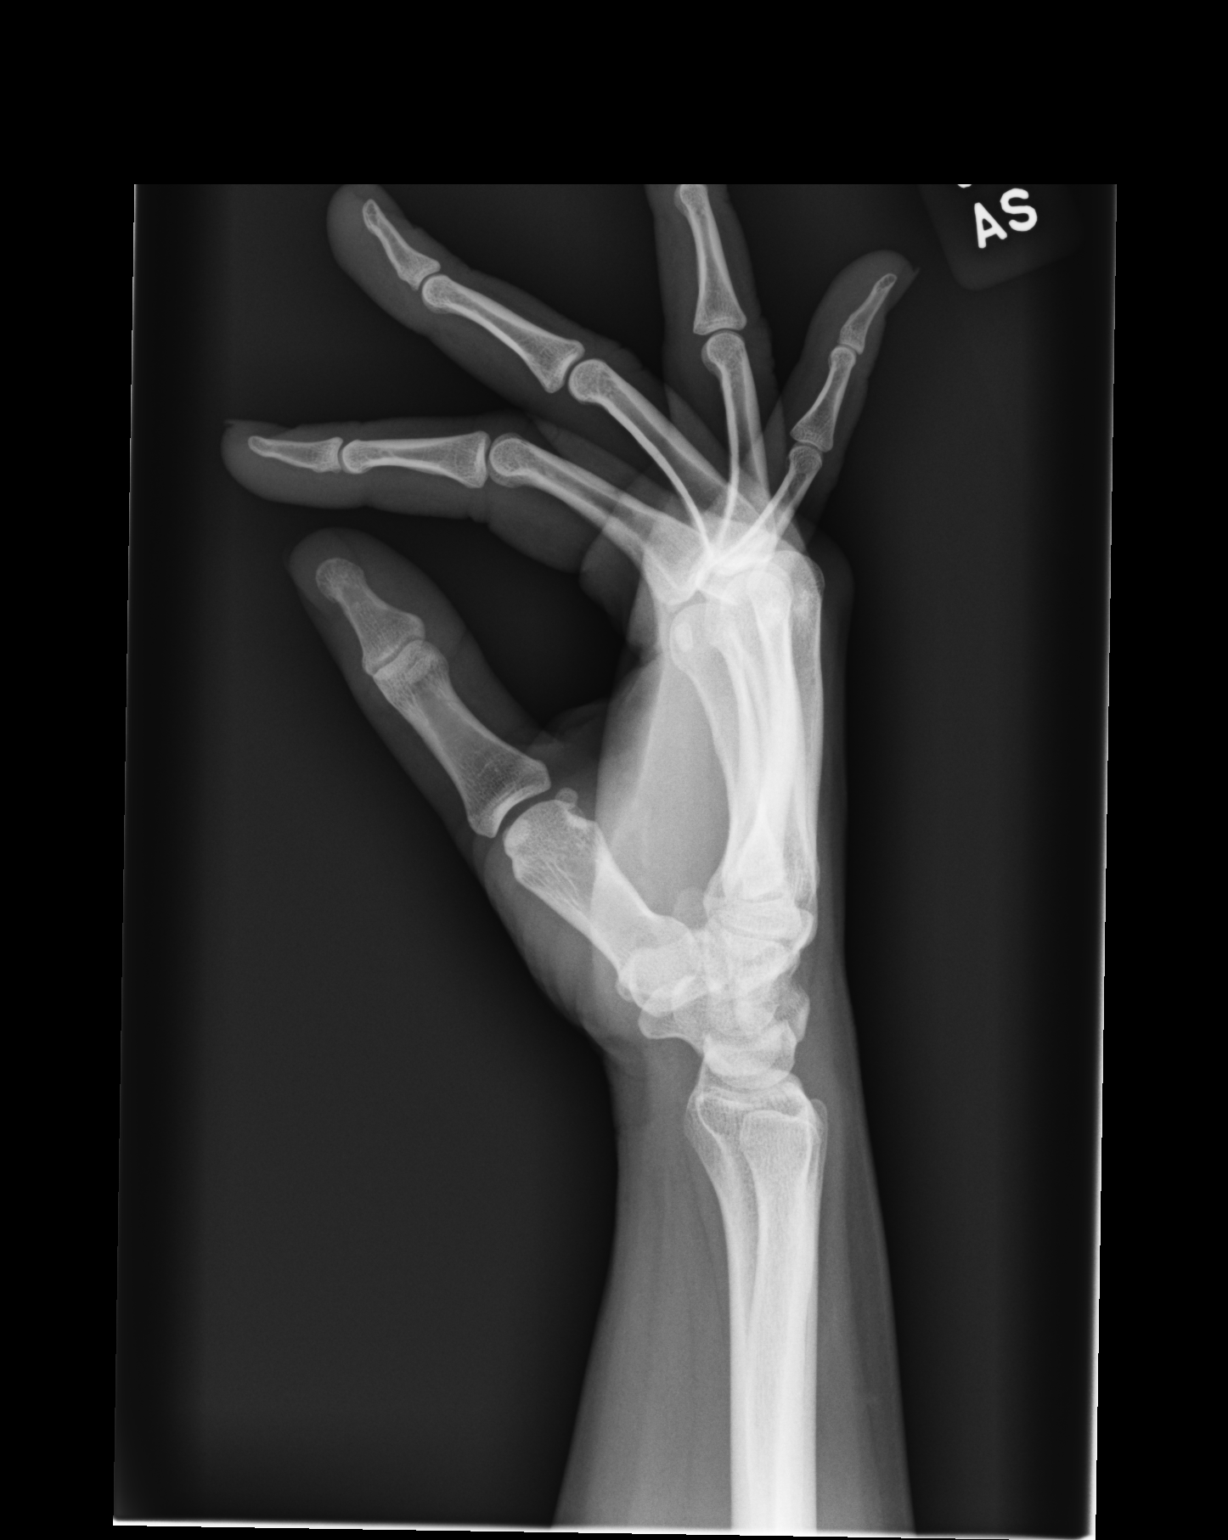

[3 of 3 positions shown; findings below may reference images not displayed]

FINDINGS: There is no evidence of fracture or dislocation. There is no
evidence of arthropathy or other focal bone abnormality. Soft
tissues are unremarkable.
IMPRESSION: Normal radiographs.

## 2022-06-01 ENCOUNTER — Other Ambulatory Visit (HOSPITAL_COMMUNITY): Payer: Self-pay

## 2022-06-01 MED ORDER — NYSTATIN-TRIAMCINOLONE 100000-0.1 UNIT/GM-% EX CREA
TOPICAL_CREAM | CUTANEOUS | 0 refills | Status: DC
  Filled 2022-06-01: qty 30, 14d supply, fill #0

## 2022-06-04 ENCOUNTER — Ambulatory Visit (INDEPENDENT_AMBULATORY_CARE_PROVIDER_SITE_OTHER): Payer: No Typology Code available for payment source

## 2022-06-04 DIAGNOSIS — J309 Allergic rhinitis, unspecified: Secondary | ICD-10-CM

## 2022-06-15 ENCOUNTER — Other Ambulatory Visit (HOSPITAL_COMMUNITY): Payer: Self-pay

## 2022-06-15 MED ORDER — METRONIDAZOLE 0.75 % VA GEL
VAGINAL | 0 refills | Status: DC
  Filled 2022-06-15 (×2): qty 70, 5d supply, fill #0

## 2022-06-22 ENCOUNTER — Other Ambulatory Visit (HOSPITAL_COMMUNITY): Payer: Self-pay

## 2022-06-22 MED ORDER — TERCONAZOLE 0.4 % VA CREA
TOPICAL_CREAM | VAGINAL | 0 refills | Status: DC
  Filled 2022-06-22: qty 45, 7d supply, fill #0

## 2022-06-24 ENCOUNTER — Other Ambulatory Visit (HOSPITAL_COMMUNITY): Payer: Self-pay

## 2022-06-30 ENCOUNTER — Ambulatory Visit (INDEPENDENT_AMBULATORY_CARE_PROVIDER_SITE_OTHER): Payer: No Typology Code available for payment source

## 2022-06-30 DIAGNOSIS — J309 Allergic rhinitis, unspecified: Secondary | ICD-10-CM

## 2022-07-02 ENCOUNTER — Other Ambulatory Visit (HOSPITAL_COMMUNITY): Payer: Self-pay

## 2022-07-02 MED ORDER — METHYLPHENIDATE HCL 5 MG PO TABS
ORAL_TABLET | ORAL | 0 refills | Status: DC
  Filled 2022-07-02: qty 30, 30d supply, fill #0

## 2022-07-06 ENCOUNTER — Other Ambulatory Visit (HOSPITAL_COMMUNITY): Payer: Self-pay

## 2022-07-06 MED ORDER — BUSPIRONE HCL 10 MG PO TABS
ORAL_TABLET | ORAL | 1 refills | Status: DC
  Filled 2022-07-06 – 2022-08-31 (×2): qty 270, 90d supply, fill #0

## 2022-07-06 MED ORDER — HYDROXYZINE HCL 10 MG PO TABS
ORAL_TABLET | ORAL | 0 refills | Status: DC
  Filled 2022-07-06: qty 240, 30d supply, fill #0

## 2022-07-08 ENCOUNTER — Other Ambulatory Visit (HOSPITAL_COMMUNITY): Payer: Self-pay

## 2022-07-14 ENCOUNTER — Other Ambulatory Visit (HOSPITAL_COMMUNITY): Payer: Self-pay

## 2022-07-24 ENCOUNTER — Ambulatory Visit: Payer: No Typology Code available for payment source | Admitting: Allergy

## 2022-07-28 ENCOUNTER — Ambulatory Visit (INDEPENDENT_AMBULATORY_CARE_PROVIDER_SITE_OTHER): Payer: No Typology Code available for payment source | Admitting: Neurology

## 2022-07-28 ENCOUNTER — Encounter: Payer: Self-pay | Admitting: Neurology

## 2022-07-28 VITALS — BP 113/71 | HR 100 | Resp 18 | Ht 63.0 in | Wt 164.0 lb

## 2022-07-28 DIAGNOSIS — R42 Dizziness and giddiness: Secondary | ICD-10-CM | POA: Diagnosis not present

## 2022-07-28 DIAGNOSIS — F0781 Postconcussional syndrome: Secondary | ICD-10-CM
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
## 2022-07-28 NOTE — Patient Instructions (Signed)
It's so good to see you doing better! All the best with your new baby. Follow-up as needed, call for any changes.

## 2022-07-30 ENCOUNTER — Ambulatory Visit: Payer: Self-pay

## 2022-07-30 DIAGNOSIS — J309 Allergic rhinitis, unspecified: Secondary | ICD-10-CM

## 2022-08-04 ENCOUNTER — Ambulatory Visit (INDEPENDENT_AMBULATORY_CARE_PROVIDER_SITE_OTHER): Payer: No Typology Code available for payment source

## 2022-08-04 DIAGNOSIS — J309 Allergic rhinitis, unspecified: Secondary | ICD-10-CM

## 2022-08-13 ENCOUNTER — Ambulatory Visit: Payer: No Typology Code available for payment source | Admitting: Allergy

## 2022-08-14 ENCOUNTER — Other Ambulatory Visit: Payer: Self-pay | Admitting: Obstetrics & Gynecology

## 2022-08-14 DIAGNOSIS — Z09 Encounter for follow-up examination after completed treatment for conditions other than malignant neoplasm: Secondary | ICD-10-CM

## 2022-08-18 ENCOUNTER — Other Ambulatory Visit (HOSPITAL_COMMUNITY): Payer: Self-pay

## 2022-08-19 ENCOUNTER — Other Ambulatory Visit (HOSPITAL_COMMUNITY): Payer: Self-pay

## 2022-08-19 MED ORDER — FREESTYLE LANCETS MISC
0 refills | Status: AC
  Filled 2022-08-19 – 2022-09-23 (×2): qty 100, 50d supply, fill #0

## 2022-08-19 MED ORDER — FREESTYLE LITE W/DEVICE KIT
PACK | 0 refills | Status: AC
  Filled 2022-08-19 – 2022-08-31 (×2): qty 1, 30d supply, fill #0
  Filled 2022-09-23: qty 1, 365d supply, fill #0

## 2022-08-19 MED ORDER — FREESTYLE LITE TEST VI STRP
ORAL_STRIP | 0 refills | Status: DC
  Filled 2022-08-19 – 2022-08-31 (×3): qty 100, 50d supply, fill #0

## 2022-08-20 ENCOUNTER — Other Ambulatory Visit (HOSPITAL_COMMUNITY): Payer: Self-pay

## 2022-08-20 ENCOUNTER — Ambulatory Visit: Payer: No Typology Code available for payment source | Admitting: Allergy & Immunology

## 2022-08-29 ENCOUNTER — Other Ambulatory Visit (HOSPITAL_COMMUNITY): Payer: Self-pay

## 2022-08-31 ENCOUNTER — Ambulatory Visit: Payer: Self-pay

## 2022-08-31 ENCOUNTER — Other Ambulatory Visit (HOSPITAL_COMMUNITY): Payer: Self-pay

## 2022-08-31 DIAGNOSIS — J309 Allergic rhinitis, unspecified: Secondary | ICD-10-CM

## 2022-09-04 ENCOUNTER — Other Ambulatory Visit (HOSPITAL_COMMUNITY): Payer: Self-pay

## 2022-09-04 ENCOUNTER — Encounter: Payer: Self-pay | Admitting: Allergy

## 2022-09-04 ENCOUNTER — Other Ambulatory Visit: Payer: Self-pay

## 2022-09-04 ENCOUNTER — Ambulatory Visit (INDEPENDENT_AMBULATORY_CARE_PROVIDER_SITE_OTHER): Payer: No Typology Code available for payment source | Admitting: Allergy

## 2022-09-04 VITALS — BP 114/72 | HR 115 | Temp 98.5°F | Resp 16 | Wt 168.0 lb

## 2022-09-04 DIAGNOSIS — J3089 Other allergic rhinitis: Secondary | ICD-10-CM

## 2022-09-04 DIAGNOSIS — J4520 Mild intermittent asthma, uncomplicated: Secondary | ICD-10-CM

## 2022-09-04 DIAGNOSIS — H1013 Acute atopic conjunctivitis, bilateral: Secondary | ICD-10-CM | POA: Diagnosis not present

## 2022-09-04 DIAGNOSIS — K200 Eosinophilic esophagitis: Secondary | ICD-10-CM

## 2022-09-04 MED ORDER — BUSPIRONE HCL 10 MG PO TABS
5.0000 mg | ORAL_TABLET | Freq: Three times a day (TID) | ORAL | 1 refills | Status: AC
  Filled 2022-09-04 – 2022-11-12 (×3): qty 270, 90d supply, fill #0
  Filled 2023-02-09: qty 270, 90d supply, fill #1

## 2022-09-04 NOTE — Progress Notes (Signed)
Follow-up Note  RE: Patricia Harvey MRN: [id number] DOB: [birth year] Date of Office Visit: 09/04/2022   History of present illness: Patricia Harvey is a 31 y.o. female presenting today for follow-up of allergic rhinitis with conjunctivitis, EOE and reactive airway.  She was last seen in the office on 03/19/2022 by myself. She is currently [redacted] weeks pregnant.  She states she has been more short of breath as the pregnancy progresses.  At times she does have a barky type cough that she states her husband has noted.  She states most days she does take Symbicort in the mornings.  She will use a second dose typically on days where she has been doing more exercising as she does note more respiratory symptoms related to exercise.  She has access to her albuterol inhaler however it is easier for her to get her Symbicort.  She states she will use albuterol on an as-needed basis but it does make her a bit jittery.  Fortunately she has not had any need for ED or urgent care visits in regards to her breathing nor she requiring systemic steroids for breathing issues.  She also states she has noted at times some nasal congestion and drainage.  Does not all the time.  She does continue to use size all daily.  She will use Pataday on a as needed basis.  Her EOE at this time continues to be under control with her dietary management.   Review of systems: Review of Systems  Constitutional: Negative.   HENT:  Positive for congestion and rhinorrhea.   Eyes: Negative.   Respiratory:  Positive for cough.   Cardiovascular: Negative.   Gastrointestinal: Negative.   Musculoskeletal: Negative.   Skin: Negative.   Allergic/Immunologic: Negative.   Neurological: Negative.        Past medical/social/surgical/family history have been reviewed and are unchanged unless specifically indicated below.  No changes  Medication List: Current Outpatient Medications  Medication Sig Dispense Refill   albuterol (PROVENTIL) (2.5  MG/3ML) 0.083% nebulizer solution USE 1 VIAL VIA NEBULIZER EVERY 4 TO 6 HOURS AS NEEDED FOR WHEEZING 75 mL 5   albuterol (VENTOLIN HFA) 108 (90 Base) MCG/ACT inhaler Inhale 2 puffs into the lungs every 4 (four) hours as needed for wheezing or shortness of breath. 18 g 2   Blood Glucose Monitoring Suppl (FREESTYLE LITE) w/Device KIT Use to test blood sugar 2 times a day 1 kit 0   Bromelains 500 MG TABS Take by mouth.     budesonide-formoterol (SYMBICORT) 160-4.5 MCG/ACT inhaler Inhale 2 puffs into the lungs 2 (two) times daily. 10.2 g 1   busPIRone (BUSPAR) 10 MG tablet Take 0.5-1 tablets (5-10 mg total) by mouth 3 (three) times daily. Take with food. 270 tablet 1   Cetrorelix Acetate (CETROTIDE) 0.25 MG KIT Reconstitute and inject 1 syringe daily or as directed. 5 kit 1   EPINEPHrine 0.3 mg/0.3 mL IJ SOAJ injection INJECT 0.3 MG INTO THE MUSCLE ONCE FOR 1 DOSE AS NEEDED FOR LIFE THREATENING ALLERGIC REACTION. 2 each 2   Lancets (FREESTYLE) lancets Use to test blood sugar 2 times a day 100 each 0   ipratropium-albuterol (DUONEB) 0.5-2.5 (3) MG/3ML SOLN INHALE 3 ML BY NEBULIZATION EVERY 6 HOURS AS NEEDED 90 mL 1   lamoTRIgine (LAMICTAL) 100 MG tablet Take 100 mg by mouth daily. Take one tablet daily     lamoTRIgine (LAMICTAL) 25 MG tablet Take 25 mg by mouth every morning. Take one tablet daily  No current facility-administered medications for this visit.     Known medication allergies: Allergies  Allergen Reactions   Grass Extracts [Gramineae Pollens] Itching   Msud Aid [Alitraq] Diarrhea   Other Shortness Of Breath and Other (See Comments)    Steroids, panic attack   Sesame Oil Hives, Itching and Anaphylaxis   Sesame Seed (Diagnostic) Shortness Of Breath, Diarrhea, Nausea Only and Other (See Comments)    cramping   Amphetamine-Dextroamphetamine Other (See Comments)    depression   Methylphenidate Other (See Comments) and Nausea Only    anxiety/frustrated   Milk-Related Compounds  Diarrhea and Nausea And Vomiting    Cramping & bloating also   Prazosin     Syncope   Wheat Bran Nausea Only and Other (See Comments)    Cramping, lower GI pain, nausea, constipation     Physical examination: Blood pressure 114/72, pulse (!) 115, temperature 98.5 F (36.9 C), temperature source Temporal, resp. rate 16, weight 168 lb (76.2 kg), SpO2 97 %.  General: Alert, interactive, in no acute distress. HEENT: PERRLA, TMs pearly gray, turbinates non-edematous without discharge, post-pharynx non erythematous. Neck: Supple without lymphadenopathy. Lungs: Clear to auscultation without wheezing, rhonchi or rales. {no increased work of breathing. CV: Normal S1, S2 without murmurs. Abdomen:gravid, nontender. Skin: Warm and dry, without lesions or rashes. Extremities:  No clubbing, cyanosis or edema. Neuro:   Grossly intact.  Diagnositics/Labs: None today  Assessment and plan:   Allergic rhinitis with conjunctivitis   - continue immunotherapy injections currently "frozen" at red vial 0.80m during remainder of pregnancy.  After delivery can resume buildup back to maintenance dosing.   - Continue avoidance measures   - Continue xyzal daily as needed.     - Continue Pataday (olopatadine) eye drops daily as needed for itchy/watery eyes    EoE  - esophageal eosinophils at her most recent endoscopy were <15/hpf  - continue current dietary avoidance for management of GI symptoms  Reactive airway   - have access to albuterol inhaler 2 puffs or 1 vial via nebulizer every 4-6 hours as needed for cough/wheeze/shortness of breath/chest tightness.  May use 15-20 minutes prior to activity.   Monitor frequency of use.      -use Symbicort 2 puffs twice a day during respiratory illnesses of flares and can use as needed (do not exceed 10 puffs per day) for cough/wheeze/shortness of breath/chest tightness.  Follow-up 3-4 months after delivery for follow-up or sooner if needed  I appreciate the  opportunity to take part in [facility] care. Please do not hesitate to contact me with questions.  Sincerely,   Prudy Feeler, MD Allergy/Immunology [facility]

## 2022-09-04 NOTE — Patient Instructions (Addendum)
Allergic rhinitis with conjunctivitis   - continue immunotherapy injections currently "frozen" at red vial 0.51m during remainder of pregnancy.  After delivery can resume buildup back to maintenance dosing.   - Continue avoidance measures   - Continue xyzal daily as needed.     - Continue Pataday (olopatadine) eye drops daily as needed for itchy/watery eyes    EoE  - esophageal eosinophils at her most recent endoscopy were <15/hpf  - continue current dietary avoidance for management of GI symptoms  Reactive airway   - have access to albuterol inhaler 2 puffs or 1 vial via nebulizer every 4-6 hours as needed for cough/wheeze/shortness of breath/chest tightness.  May use 15-20 minutes prior to activity.   Monitor frequency of use.      -use Symbicort 2 puffs twice a day during respiratory illnesses of flares and can use as needed (do not exceed 10 puffs per day) for cough/wheeze/shortness of breath/chest tightness.  Follow-up 3-4 months for follow-up or sooner if needed

## 2022-09-09 ENCOUNTER — Other Ambulatory Visit (HOSPITAL_COMMUNITY): Payer: Self-pay

## 2022-09-23 ENCOUNTER — Other Ambulatory Visit (HOSPITAL_COMMUNITY): Payer: Self-pay

## 2022-09-23 MED ORDER — FREESTYLE LITE TEST VI STRP
ORAL_STRIP | 0 refills | Status: AC
  Filled 2022-09-23: qty 100, 50d supply, fill #0

## 2022-09-24 ENCOUNTER — Other Ambulatory Visit (HOSPITAL_COMMUNITY): Payer: Self-pay

## 2022-09-25 ENCOUNTER — Ambulatory Visit (INDEPENDENT_AMBULATORY_CARE_PROVIDER_SITE_OTHER): Payer: No Typology Code available for payment source

## 2022-09-25 DIAGNOSIS — J309 Allergic rhinitis, unspecified: Secondary | ICD-10-CM

## 2022-10-02 ENCOUNTER — Ambulatory Visit: Payer: No Typology Code available for payment source | Admitting: Allergy

## 2022-10-14 ENCOUNTER — Ambulatory Visit (INDEPENDENT_AMBULATORY_CARE_PROVIDER_SITE_OTHER): Payer: No Typology Code available for payment source | Admitting: Internal Medicine

## 2022-10-14 DIAGNOSIS — J309 Allergic rhinitis, unspecified: Secondary | ICD-10-CM | POA: Diagnosis not present

## 2022-10-15 ENCOUNTER — Other Ambulatory Visit: Payer: Self-pay | Admitting: Allergy

## 2022-10-15 ENCOUNTER — Other Ambulatory Visit (HOSPITAL_COMMUNITY): Payer: Self-pay

## 2022-10-15 MED ORDER — LEVOCETIRIZINE DIHYDROCHLORIDE 5 MG PO TABS
5.0000 mg | ORAL_TABLET | Freq: Every day | ORAL | 11 refills | Status: AC | PRN
  Filled 2022-10-15: qty 60, 60d supply, fill #0

## 2022-10-16 ENCOUNTER — Other Ambulatory Visit (HOSPITAL_COMMUNITY): Payer: Self-pay

## 2022-10-19 ENCOUNTER — Other Ambulatory Visit (HOSPITAL_COMMUNITY): Payer: Self-pay

## 2022-10-19 ENCOUNTER — Other Ambulatory Visit: Payer: Self-pay | Admitting: Allergy

## 2022-10-19 MED ORDER — ALBUTEROL SULFATE HFA 108 (90 BASE) MCG/ACT IN AERS
2.0000 | INHALATION_SPRAY | RESPIRATORY_TRACT | 1 refills | Status: AC | PRN
  Filled 2022-10-19: qty 6.7, 16d supply, fill #0

## 2022-10-19 MED ORDER — EPINEPHRINE 0.3 MG/0.3ML IJ SOAJ
INTRAMUSCULAR | 1 refills | Status: AC
  Filled 2022-10-19 – 2022-11-12 (×2): qty 2, 30d supply, fill #0
  Filled 2023-03-24: qty 2, 30d supply, fill #1

## 2022-10-20 ENCOUNTER — Other Ambulatory Visit (HOSPITAL_COMMUNITY): Payer: Self-pay

## 2022-10-23 ENCOUNTER — Other Ambulatory Visit (HOSPITAL_COMMUNITY): Payer: Self-pay

## 2022-10-29 ENCOUNTER — Ambulatory Visit: Payer: No Typology Code available for payment source | Admitting: Allergy

## 2022-11-04 ENCOUNTER — Other Ambulatory Visit (HOSPITAL_COMMUNITY): Payer: Self-pay

## 2022-11-05 ENCOUNTER — Other Ambulatory Visit (HOSPITAL_COMMUNITY): Payer: Self-pay

## 2022-11-12 ENCOUNTER — Other Ambulatory Visit (HOSPITAL_COMMUNITY): Payer: Self-pay

## 2022-11-12 ENCOUNTER — Ambulatory Visit (INDEPENDENT_AMBULATORY_CARE_PROVIDER_SITE_OTHER): Payer: No Typology Code available for payment source

## 2022-11-12 DIAGNOSIS — J309 Allergic rhinitis, unspecified: Secondary | ICD-10-CM | POA: Diagnosis not present

## 2022-11-19 ENCOUNTER — Telehealth: Payer: Self-pay

## 2022-11-19 ENCOUNTER — Ambulatory Visit (INDEPENDENT_AMBULATORY_CARE_PROVIDER_SITE_OTHER): Payer: No Typology Code available for payment source

## 2022-11-19 DIAGNOSIS — J309 Allergic rhinitis, unspecified: Secondary | ICD-10-CM | POA: Diagnosis not present

## 2022-11-19 MED ORDER — TRIAMCINOLONE ACETONIDE 0.025 % EX OINT
1.0000 | TOPICAL_OINTMENT | Freq: Two times a day (BID) | CUTANEOUS | 1 refills | Status: AC
  Filled 2022-11-19 – 2023-02-16 (×2): qty 80, 30d supply, fill #0

## 2022-11-19 NOTE — Telephone Encounter (Signed)
Agree with the plan. I hope she gives updates as she goes along. She is also going to use some eye drops instead of antihistamines to help control her symptoms.   Her 3wk baby is adorbs. She thankfully showed up pictures.   Malachi Bonds, MD [facility]

## 2022-11-19 NOTE — Telephone Encounter (Signed)
Patient came in for her allergy injection today and expressed that she is stopping her antihistamines as they dry up her milk supply and she is currently nursing. I voiced concerned to Dr. Dellis Anes and he verbalized to send Kenalog ointment for patient. I sent in the Kenalog to [facility].

## 2022-11-20 ENCOUNTER — Other Ambulatory Visit (HOSPITAL_COMMUNITY): Payer: Self-pay

## 2022-11-20 ENCOUNTER — Other Ambulatory Visit: Payer: Self-pay

## 2022-11-24 ENCOUNTER — Other Ambulatory Visit (HOSPITAL_COMMUNITY): Payer: Self-pay

## 2022-11-25 ENCOUNTER — Other Ambulatory Visit: Payer: No Typology Code available for payment source

## 2022-11-26 ENCOUNTER — Ambulatory Visit (INDEPENDENT_AMBULATORY_CARE_PROVIDER_SITE_OTHER): Payer: No Typology Code available for payment source

## 2022-11-26 DIAGNOSIS — J309 Allergic rhinitis, unspecified: Secondary | ICD-10-CM

## 2022-11-27 ENCOUNTER — Encounter: Payer: Self-pay | Admitting: Obstetrics & Gynecology

## 2022-11-29 ENCOUNTER — Ambulatory Visit
Admission: RE | Admit: 2022-11-29 | Discharge: 2022-11-29 | Disposition: A | Discharge status: Home / Self Care | Payer: No Typology Code available for payment source | Source: Ambulatory Visit | Attending: Obstetrics & Gynecology | Admitting: Obstetrics & Gynecology

## 2022-11-29 ENCOUNTER — Other Ambulatory Visit: Payer: No Typology Code available for payment source

## 2022-11-29 DIAGNOSIS — Z09 Encounter for follow-up examination after completed treatment for conditions other than malignant neoplasm: Secondary | ICD-10-CM

## 2022-11-29 MED ORDER — GADOPICLENOL 0.5 MMOL/ML IV SOLN
8.0000 mL | Freq: Once | INTRAVENOUS | Status: AC | PRN
  Administered 2022-11-29: 8 mL via INTRAVENOUS

## 2022-12-01 ENCOUNTER — Ambulatory Visit (INDEPENDENT_AMBULATORY_CARE_PROVIDER_SITE_OTHER): Payer: 59

## 2022-12-01 DIAGNOSIS — J309 Allergic rhinitis, unspecified: Secondary | ICD-10-CM

## 2022-12-07 ENCOUNTER — Other Ambulatory Visit (HOSPITAL_COMMUNITY): Payer: Self-pay

## 2022-12-07 DIAGNOSIS — F413 Other mixed anxiety disorders: Secondary | ICD-10-CM | POA: Diagnosis not present

## 2022-12-07 DIAGNOSIS — F411 Generalized anxiety disorder: Secondary | ICD-10-CM | POA: Diagnosis not present

## 2022-12-16 ENCOUNTER — Other Ambulatory Visit (HOSPITAL_BASED_OUTPATIENT_CLINIC_OR_DEPARTMENT_OTHER): Payer: Self-pay

## 2022-12-16 ENCOUNTER — Other Ambulatory Visit: Payer: Self-pay

## 2022-12-16 MED ORDER — BUSPIRONE HCL 10 MG PO TABS
5.0000 mg | ORAL_TABLET | Freq: Three times a day (TID) | ORAL | 1 refills | Status: AC
  Filled 2022-12-16: qty 270, 90d supply, fill #0

## 2022-12-17 ENCOUNTER — Other Ambulatory Visit (HOSPITAL_COMMUNITY): Payer: Self-pay

## 2022-12-18 ENCOUNTER — Other Ambulatory Visit (HOSPITAL_COMMUNITY): Payer: Self-pay

## 2022-12-18 MED ORDER — ESTRADIOL 0.1 MG/GM VA CREA
TOPICAL_CREAM | Freq: Every evening | VAGINAL | 0 refills | Status: AC
  Filled 2022-12-18: qty 42.5, 30d supply, fill #0

## 2022-12-21 DIAGNOSIS — F411 Generalized anxiety disorder: Secondary | ICD-10-CM | POA: Diagnosis not present

## 2022-12-21 DIAGNOSIS — F413 Other mixed anxiety disorders: Secondary | ICD-10-CM | POA: Diagnosis not present

## 2022-12-29 ENCOUNTER — Ambulatory Visit (INDEPENDENT_AMBULATORY_CARE_PROVIDER_SITE_OTHER): Payer: 59

## 2022-12-29 DIAGNOSIS — J309 Allergic rhinitis, unspecified: Secondary | ICD-10-CM | POA: Diagnosis not present

## 2022-12-31 DIAGNOSIS — O94 Sequelae of complication of pregnancy, childbirth, and the puerperium: Secondary | ICD-10-CM | POA: Diagnosis not present

## 2023-01-06 ENCOUNTER — Ambulatory Visit: Payer: 59 | Admitting: Allergy

## 2023-01-09 DIAGNOSIS — R112 Nausea with vomiting, unspecified: Secondary | ICD-10-CM | POA: Diagnosis not present

## 2023-01-09 DIAGNOSIS — E860 Dehydration: Secondary | ICD-10-CM | POA: Diagnosis not present

## 2023-01-09 DIAGNOSIS — R197 Diarrhea, unspecified: Secondary | ICD-10-CM | POA: Diagnosis not present

## 2023-01-09 DIAGNOSIS — K529 Noninfective gastroenteritis and colitis, unspecified: Secondary | ICD-10-CM | POA: Diagnosis not present

## 2023-01-18 DIAGNOSIS — F411 Generalized anxiety disorder: Secondary | ICD-10-CM | POA: Diagnosis not present

## 2023-01-18 DIAGNOSIS — F410 Panic disorder [episodic paroxysmal anxiety] without agoraphobia: Secondary | ICD-10-CM | POA: Diagnosis not present

## 2023-01-18 DIAGNOSIS — F413 Other mixed anxiety disorders: Secondary | ICD-10-CM | POA: Diagnosis not present

## 2023-01-19 DIAGNOSIS — F411 Generalized anxiety disorder: Secondary | ICD-10-CM | POA: Diagnosis not present

## 2023-01-21 DIAGNOSIS — F411 Generalized anxiety disorder: Secondary | ICD-10-CM | POA: Diagnosis not present

## 2023-01-26 ENCOUNTER — Ambulatory Visit (INDEPENDENT_AMBULATORY_CARE_PROVIDER_SITE_OTHER): Payer: 59

## 2023-01-26 DIAGNOSIS — J309 Allergic rhinitis, unspecified: Secondary | ICD-10-CM | POA: Diagnosis not present

## 2023-01-28 ENCOUNTER — Ambulatory Visit (INDEPENDENT_AMBULATORY_CARE_PROVIDER_SITE_OTHER): Payer: 59 | Admitting: Allergy

## 2023-01-28 ENCOUNTER — Encounter: Payer: Self-pay | Admitting: Allergy

## 2023-01-28 ENCOUNTER — Other Ambulatory Visit: Payer: Self-pay

## 2023-01-28 VITALS — BP 106/70 | HR 94 | Temp 98.1°F | Resp 16 | Ht 63.58 in | Wt 152.2 lb

## 2023-01-28 DIAGNOSIS — J4520 Mild intermittent asthma, uncomplicated: Secondary | ICD-10-CM | POA: Diagnosis not present

## 2023-01-28 DIAGNOSIS — J3089 Other allergic rhinitis: Secondary | ICD-10-CM | POA: Diagnosis not present

## 2023-01-28 DIAGNOSIS — H1013 Acute atopic conjunctivitis, bilateral: Secondary | ICD-10-CM

## 2023-01-28 DIAGNOSIS — F411 Generalized anxiety disorder: Secondary | ICD-10-CM | POA: Diagnosis not present

## 2023-01-28 DIAGNOSIS — K200 Eosinophilic esophagitis: Secondary | ICD-10-CM

## 2023-01-28 NOTE — Patient Instructions (Addendum)
Allergic rhinitis with conjunctivitis   - Continue avoidance measures   - holding Xyzal currently while breastfeeding.  Can use if needed    - Use Pataday (olopatadine) eye drops daily as needed for itchy/watery eyes   - Continue allergen immunotherapy per protocol at maintenance dosing with plans to stop in June/July    EoE  - esophageal eosinophils at her most recent endoscopy were <15/hpf  - however increased swallowing difficulty  - continue current dietary avoidance   - will place new GI referral for Dr Candis Schatz  Reactive airway   - have access to albuterol inhaler 2 puffs or 1 vial via nebulizer every 4-6 hours as needed for cough/wheeze/shortness of breath/chest tightness.  May use 15-20 minutes prior to activity.   Monitor frequency of use.      -use Symbicort 2 puffs twice a day during respiratory illnesses of flares and can use as needed (do not exceed 10 puffs per day) for cough/wheeze/shortness of breath/chest tightness.  Follow-up 4-6 months for follow-up or sooner if needed

## 2023-01-28 NOTE — Progress Notes (Signed)
Follow-up Note  RE: [name] MRN: [id number] DOB: 10/02/1991 Date of Office Visit: 01/28/2023   History of present illness: Patricia Harvey is a 32 y.o. female presenting today for follow-up of allergic rhinitis with conjunctivitis, EOE and reactive airway.  She was last seen in the office on 09/04/22 by myself.  She is now a mom to [place] who is 9mo  She is on allergen immunotherapy and we did freeze her shots during her pregnancy and after delivery she was able to advance back to maintenance dosing.  She is doing well with immunotherapy without large local or systemic reactions.  Currently not have any significant allergy symptoms and holding xyzal while breastfeeding but not needing this.  Current plan is to stop immunotherapy this summer and see how she does off therapy.   With her breathing she has noted some periods of shortness of breath with breastfeeding especially.  She did need to use her inhalers during delivery as she was in labor for about 4 hours.  She has used inhalers after delivery as well but not consistently.  She has both symbicort and albuterol.   She is noting more difficulty with swallowing and feeling like food is getting caught up in neck area.  Happens more with dryer foods and she is needed to drink more water with eating.  She has history of EOE managed with dietary modifications.  She was previously seeing Dr MCollene Maresbut has been a while since she has had follow-up.    Review of systems: Review of Systems  Constitutional: Negative.   HENT: Negative.    Eyes: Negative.   Respiratory:         See HPI  Cardiovascular: Negative.   Gastrointestinal: Negative.   Musculoskeletal: Negative.   Skin:        See HPI  Allergic/Immunologic: Negative.   Neurological: Negative.      All other systems negative unless noted above in HPI  Past medical/social/surgical/family history have been reviewed and are unchanged unless specifically indicated below.  No  changes  Medication List: Current Outpatient Medications  Medication Sig Dispense Refill   albuterol (VENTOLIN HFA) 108 (90 Base) MCG/ACT inhaler INHALE 2 PUFFS INTO THE LUNGS EVERY 4 HOURS AS NEEDED FOR WHEEZING OR SHORTNESS OF BREATH. 6.7 g 1   Blood Glucose Monitoring Suppl (FREESTYLE LITE) w/Device KIT Use to test blood sugar 2 times a day 1 kit 0   Bromelains 500 MG TABS Take by mouth.     budesonide-formoterol (SYMBICORT) 160-4.5 MCG/ACT inhaler Inhale 2 puffs into the lungs 2 (two) times daily. 10.2 g 1   busPIRone (BUSPAR) 10 MG tablet Take 1/2 - 1 tablet (5 - 10 mg total) by mouth 3 times daily. Take with food. 270 tablet 1   busPIRone (BUSPAR) 10 MG tablet Take 0.5-1 tablets (5-10 mg total) by mouth 3 (three) times daily (take with food). 270 tablet 1   EPINEPHrine 0.3 mg/0.3 mL IJ SOAJ injection INJECT 0.3 MG INTO THE MUSCLE ONCE FOR 1 DOSE AS NEEDED FOR LIFE THREATENING ALLERGIC REACTION. 2 each 2   EPINEPHrine 0.3 mg/0.3 mL IJ SOAJ injection INJECT 0.3 MG INTO THE MUSCLE ONCE FOR 1 DOSE AS NEEDED FOR LIFE THREATENING ALLERGIC REACTION. 2 each 1   estradiol (ESTRACE) 0.1 MG/GM vaginal cream use a pea sized amount in vagina nightly 42.5 g 0   glucose blood (FREESTYLE LITE) test strip Test blood sugar twice daily as directed 100 strip 0   lamoTRIgine (  LAMICTAL) 100 MG tablet Take 100 mg by mouth daily. Take one tablet daily     lamoTRIgine (LAMICTAL) 25 MG tablet Take 25 mg by mouth every morning. Take one tablet daily     Lancets (FREESTYLE) lancets Use to test blood sugar 2 times a day 100 each 0   levocetirizine (XYZAL) 5 MG tablet Take 1 tablet (5 mg total) by mouth daily as needed for allergies (Can take an extra dose during flares). 60 tablet 11   triamcinolone (KENALOG) 0.025 % ointment Apply 1 Application topically 2 (two) times daily. Use after allergy injection 80 g 1   albuterol (PROVENTIL) (2.5 MG/3ML) 0.083% nebulizer solution USE 1 VIAL VIA NEBULIZER EVERY 4 TO 6 HOURS AS  NEEDED FOR WHEEZING 75 mL 5   albuterol (VENTOLIN HFA) 108 (90 Base) MCG/ACT inhaler Inhale 2 puffs into the lungs every 4 (four) hours as needed for wheezing or shortness of breath. (Patient not taking: Reported on 01/28/2023) 18 g 2   Cetrorelix Acetate (CETROTIDE) 0.25 MG KIT Reconstitute and inject 1 syringe daily or as directed. 5 kit 1   ipratropium-albuterol (DUONEB) 0.5-2.5 (3) MG/3ML SOLN INHALE 3 ML BY NEBULIZATION EVERY 6 HOURS AS NEEDED 90 mL 1   No current facility-administered medications for this visit.     Known medication allergies: Allergies  Allergen Reactions   Grass Extracts [Gramineae Pollens] Itching   Msud Aid [Alitraq] Diarrhea   Other Shortness Of Breath and Other (See Comments)    Steroids, panic attack   Sesame Oil Hives, Itching and Anaphylaxis   Sesame Seed (Diagnostic) Shortness Of Breath, Diarrhea, Nausea Only and Other (See Comments)    cramping   Amphetamine-Dextroamphetamine Other (See Comments)    depression   Methylphenidate Other (See Comments) and Nausea Only    anxiety/frustrated   Milk-Related Compounds Diarrhea and Nausea And Vomiting    Cramping & bloating also   Prazosin     Syncope   Wheat Bran Nausea Only and Other (See Comments)    Cramping, lower GI pain, nausea, constipation     Physical examination: Blood pressure 106/70, pulse 94, temperature 98.1 F (36.7 C), temperature source Temporal, resp. rate 16, height 5' 3.58" (1.615 m), weight 152 lb 3.2 oz (69 kg), SpO2 95 %.  General: Alert, interactive, in no acute distress. HEENT: PERRLA, TMs pearly gray, turbinates non-edematous without discharge, post-pharynx non erythematous. Neck: Supple without lymphadenopathy. Lungs: Clear to auscultation without wheezing, rhonchi or rales. {no increased work of breathing. CV: Normal S1, S2 without murmurs. Abdomen: Nondistended, nontender. Skin: Warm and dry, without lesions or rashes. Extremities:  No clubbing, cyanosis or edema. Neuro:    Grossly intact.  Diagnositics/Labs: ACT score 23 - good control  Spirometry: FEV1: 2.64L 84%, FVC: 3.19L 86%, ratio consistent with nonbstructive pattern   Assessment and plan: Allergic rhinitis with conjunctivitis   - Continue avoidance measures   - holding Xyzal currently while breastfeeding.  Can use if needed    - Use Pataday (olopatadine) eye drops daily as needed for itchy/watery eyes   - Continue allergen immunotherapy per protocol at maintenance dosing with plans to stop in June/July    EoE  - esophageal eosinophils at her most recent endoscopy were <15/hpf  - however increased swallowing difficulty  - continue current dietary avoidance   - will place new GI referral for Dr Candis Schatz  Reactive airway   - have access to albuterol inhaler 2 puffs or 1 vial via nebulizer every 4-6 hours as needed for  cough/wheeze/shortness of breath/chest tightness.  May use 15-20 minutes prior to activity.   Monitor frequency of use.      -use Symbicort 2 puffs twice a day during respiratory illnesses of flares and can use as needed (do not exceed 10 puffs per day) for cough/wheeze/shortness of breath/chest tightness.  Follow-up 4-6 months for follow-up or sooner if needed  I appreciate the opportunity to take part in [place] care. Please do not hesitate to contact me with questions.  Sincerely,   Prudy Feeler, MD Allergy/Immunology [facility]

## 2023-02-01 DIAGNOSIS — F410 Panic disorder [episodic paroxysmal anxiety] without agoraphobia: Secondary | ICD-10-CM | POA: Diagnosis not present

## 2023-02-06 ENCOUNTER — Encounter: Payer: Self-pay | Admitting: Allergy

## 2023-02-08 DIAGNOSIS — F331 Major depressive disorder, recurrent, moderate: Secondary | ICD-10-CM | POA: Diagnosis not present

## 2023-02-08 DIAGNOSIS — F429 Obsessive-compulsive disorder, unspecified: Secondary | ICD-10-CM | POA: Diagnosis not present

## 2023-02-08 DIAGNOSIS — F902 Attention-deficit hyperactivity disorder, combined type: Secondary | ICD-10-CM | POA: Diagnosis not present

## 2023-02-08 DIAGNOSIS — F4310 Post-traumatic stress disorder, unspecified: Secondary | ICD-10-CM | POA: Diagnosis not present

## 2023-02-10 ENCOUNTER — Other Ambulatory Visit (HOSPITAL_COMMUNITY): Payer: Self-pay

## 2023-02-16 ENCOUNTER — Other Ambulatory Visit (HOSPITAL_COMMUNITY): Payer: Self-pay

## 2023-02-18 DIAGNOSIS — R1310 Dysphagia, unspecified: Secondary | ICD-10-CM | POA: Diagnosis not present

## 2023-02-18 DIAGNOSIS — K200 Eosinophilic esophagitis: Secondary | ICD-10-CM | POA: Diagnosis not present

## 2023-02-18 DIAGNOSIS — K580 Irritable bowel syndrome with diarrhea: Secondary | ICD-10-CM | POA: Diagnosis not present

## 2023-02-18 DIAGNOSIS — R140 Abdominal distension (gaseous): Secondary | ICD-10-CM | POA: Diagnosis not present

## 2023-02-19 ENCOUNTER — Other Ambulatory Visit (HOSPITAL_COMMUNITY): Payer: Self-pay

## 2023-02-19 MED ORDER — FLUTICASONE-SALMETEROL 115-21 MCG/ACT IN AERO
2.0000 | INHALATION_SPRAY | Freq: Two times a day (BID) | RESPIRATORY_TRACT | 12 refills | Status: DC
  Filled 2023-02-19: qty 12, 30d supply, fill #0
  Filled 2023-12-17: qty 12, 30d supply, fill #1

## 2023-02-24 DIAGNOSIS — M5383 Other specified dorsopathies, cervicothoracic region: Secondary | ICD-10-CM | POA: Diagnosis not present

## 2023-02-24 DIAGNOSIS — M609 Myositis, unspecified: Secondary | ICD-10-CM | POA: Diagnosis not present

## 2023-02-24 DIAGNOSIS — M9901 Segmental and somatic dysfunction of cervical region: Secondary | ICD-10-CM | POA: Diagnosis not present

## 2023-02-24 DIAGNOSIS — M542 Cervicalgia: Secondary | ICD-10-CM | POA: Diagnosis not present

## 2023-03-01 DIAGNOSIS — F411 Generalized anxiety disorder: Secondary | ICD-10-CM | POA: Diagnosis not present

## 2023-03-02 DIAGNOSIS — F410 Panic disorder [episodic paroxysmal anxiety] without agoraphobia: Secondary | ICD-10-CM | POA: Diagnosis not present

## 2023-03-03 ENCOUNTER — Ambulatory Visit (INDEPENDENT_AMBULATORY_CARE_PROVIDER_SITE_OTHER): Payer: 59

## 2023-03-03 DIAGNOSIS — J309 Allergic rhinitis, unspecified: Secondary | ICD-10-CM | POA: Diagnosis not present

## 2023-03-08 DIAGNOSIS — M9901 Segmental and somatic dysfunction of cervical region: Secondary | ICD-10-CM | POA: Diagnosis not present

## 2023-03-08 DIAGNOSIS — M609 Myositis, unspecified: Secondary | ICD-10-CM | POA: Diagnosis not present

## 2023-03-08 DIAGNOSIS — M542 Cervicalgia: Secondary | ICD-10-CM | POA: Diagnosis not present

## 2023-03-08 DIAGNOSIS — M5383 Other specified dorsopathies, cervicothoracic region: Secondary | ICD-10-CM | POA: Diagnosis not present

## 2023-03-15 DIAGNOSIS — F411 Generalized anxiety disorder: Secondary | ICD-10-CM | POA: Diagnosis not present

## 2023-03-16 DIAGNOSIS — F411 Generalized anxiety disorder: Secondary | ICD-10-CM | POA: Diagnosis not present

## 2023-03-22 DIAGNOSIS — M542 Cervicalgia: Secondary | ICD-10-CM | POA: Diagnosis not present

## 2023-03-22 DIAGNOSIS — M5383 Other specified dorsopathies, cervicothoracic region: Secondary | ICD-10-CM | POA: Diagnosis not present

## 2023-03-22 DIAGNOSIS — M609 Myositis, unspecified: Secondary | ICD-10-CM | POA: Diagnosis not present

## 2023-03-22 DIAGNOSIS — M9901 Segmental and somatic dysfunction of cervical region: Secondary | ICD-10-CM | POA: Diagnosis not present

## 2023-03-24 ENCOUNTER — Other Ambulatory Visit (HOSPITAL_COMMUNITY): Payer: Self-pay

## 2023-03-24 ENCOUNTER — Telehealth: Payer: Self-pay

## 2023-03-24 ENCOUNTER — Other Ambulatory Visit: Payer: Self-pay

## 2023-03-24 ENCOUNTER — Other Ambulatory Visit: Payer: Self-pay | Admitting: Allergy

## 2023-03-24 MED ORDER — BUDESONIDE-FORMOTEROL FUMARATE 160-4.5 MCG/ACT IN AERO
2.0000 | INHALATION_SPRAY | Freq: Two times a day (BID) | RESPIRATORY_TRACT | 3 refills | Status: DC
  Filled 2023-03-24: qty 10.2, 30d supply, fill #0

## 2023-03-24 MED ORDER — ALBUTEROL SULFATE (2.5 MG/3ML) 0.083% IN NEBU
3.0000 mL | INHALATION_SOLUTION | RESPIRATORY_TRACT | 5 refills | Status: DC | PRN
  Filled 2023-03-24: qty 90, 5d supply, fill #0
  Filled 2023-12-17: qty 90, 5d supply, fill #1

## 2023-03-24 MED ORDER — AZELASTINE HCL 0.1 % NA SOLN
2.0000 | Freq: Two times a day (BID) | NASAL | 5 refills | Status: DC
  Filled 2023-03-24: qty 30, 25d supply, fill #0

## 2023-03-24 MED ORDER — BUDESONIDE-FORMOTEROL FUMARATE 160-4.5 MCG/ACT IN AERO
2.0000 | INHALATION_SPRAY | Freq: Two times a day (BID) | RESPIRATORY_TRACT | 3 refills | Status: DC
  Filled 2023-03-25: qty 10.2, 30d supply, fill #0
  Filled 2023-04-19: qty 10.2, 30d supply, fill #1

## 2023-03-24 NOTE — Telephone Encounter (Signed)
Patient called stating that she has a cold and is requesting Astelin and Atrovent. Patient has some however they are expired. It has been a while since we have sent in these and it is not on any after visit summary in over a year. She is also requesting we send in Symbicort and her nebulizer mediations. Patient is wondering if the nasal sprays will dry up her milk supply as she is breastfeeding. Patient also wanted to inform Dr. Delorse Lek that she is not stopping injections until after she has finished breastfeeding as she can not take any antihistamine while breastfeeding. Patient would like medications sent to [facility]. Please advise.

## 2023-03-24 NOTE — Telephone Encounter (Signed)
Called however was unable to leave a message due to it being full.

## 2023-03-24 NOTE — Addendum Note (Signed)
Addended by: Dub Mikes on: 03/24/2023 06:13 PM   Modules accepted: Orders

## 2023-03-25 ENCOUNTER — Other Ambulatory Visit: Payer: Self-pay

## 2023-03-25 ENCOUNTER — Other Ambulatory Visit (HOSPITAL_COMMUNITY): Payer: Self-pay

## 2023-03-25 NOTE — Telephone Encounter (Signed)
Patient called back and notified of note per Dr. Delorse Lek. Patient verbalized understanding.

## 2023-03-26 ENCOUNTER — Encounter: Payer: Self-pay | Admitting: Allergy

## 2023-03-29 DIAGNOSIS — F411 Generalized anxiety disorder: Secondary | ICD-10-CM | POA: Diagnosis not present

## 2023-03-29 NOTE — Telephone Encounter (Signed)
Forwarding to Dr. Delorse Lek. Thank you

## 2023-03-30 DIAGNOSIS — H6590 Unspecified nonsuppurative otitis media, unspecified ear: Secondary | ICD-10-CM | POA: Diagnosis not present

## 2023-03-30 DIAGNOSIS — J329 Chronic sinusitis, unspecified: Secondary | ICD-10-CM | POA: Diagnosis not present

## 2023-04-05 DIAGNOSIS — M9901 Segmental and somatic dysfunction of cervical region: Secondary | ICD-10-CM | POA: Diagnosis not present

## 2023-04-05 DIAGNOSIS — F4310 Post-traumatic stress disorder, unspecified: Secondary | ICD-10-CM | POA: Diagnosis not present

## 2023-04-05 DIAGNOSIS — M5383 Other specified dorsopathies, cervicothoracic region: Secondary | ICD-10-CM | POA: Diagnosis not present

## 2023-04-05 DIAGNOSIS — M542 Cervicalgia: Secondary | ICD-10-CM | POA: Diagnosis not present

## 2023-04-05 DIAGNOSIS — F331 Major depressive disorder, recurrent, moderate: Secondary | ICD-10-CM | POA: Diagnosis not present

## 2023-04-05 DIAGNOSIS — M609 Myositis, unspecified: Secondary | ICD-10-CM | POA: Diagnosis not present

## 2023-04-05 DIAGNOSIS — F902 Attention-deficit hyperactivity disorder, combined type: Secondary | ICD-10-CM | POA: Diagnosis not present

## 2023-04-05 DIAGNOSIS — F429 Obsessive-compulsive disorder, unspecified: Secondary | ICD-10-CM | POA: Diagnosis not present

## 2023-04-12 DIAGNOSIS — F411 Generalized anxiety disorder: Secondary | ICD-10-CM | POA: Diagnosis not present

## 2023-04-22 DIAGNOSIS — M9901 Segmental and somatic dysfunction of cervical region: Secondary | ICD-10-CM | POA: Diagnosis not present

## 2023-04-22 DIAGNOSIS — M542 Cervicalgia: Secondary | ICD-10-CM | POA: Diagnosis not present

## 2023-04-22 DIAGNOSIS — M609 Myositis, unspecified: Secondary | ICD-10-CM | POA: Diagnosis not present

## 2023-04-22 DIAGNOSIS — M5383 Other specified dorsopathies, cervicothoracic region: Secondary | ICD-10-CM | POA: Diagnosis not present

## 2023-04-27 ENCOUNTER — Other Ambulatory Visit (HOSPITAL_COMMUNITY): Payer: Self-pay

## 2023-04-28 DIAGNOSIS — G5603 Carpal tunnel syndrome, bilateral upper limbs: Secondary | ICD-10-CM | POA: Diagnosis not present

## 2023-04-28 DIAGNOSIS — G5602 Carpal tunnel syndrome, left upper limb: Secondary | ICD-10-CM | POA: Diagnosis not present

## 2023-04-28 DIAGNOSIS — M25532 Pain in left wrist: Secondary | ICD-10-CM | POA: Diagnosis not present

## 2023-04-28 DIAGNOSIS — M25531 Pain in right wrist: Secondary | ICD-10-CM | POA: Diagnosis not present

## 2023-05-10 DIAGNOSIS — F411 Generalized anxiety disorder: Secondary | ICD-10-CM | POA: Diagnosis not present

## 2023-05-13 ENCOUNTER — Telehealth: Payer: Self-pay

## 2023-05-13 ENCOUNTER — Ambulatory Visit: Payer: Self-pay

## 2023-05-13 DIAGNOSIS — J309 Allergic rhinitis, unspecified: Secondary | ICD-10-CM

## 2023-05-13 NOTE — Telephone Encounter (Signed)
Patient came in today for an allergy injection 03/03/23. Her vial expired on 05/06/23. Patient has been notified that her vial needs to be re ordered and it usually takes up to 3 weeks to have the vials made.    She was supposed to stop allergy shots  in June to see how she does off of them per Dr. Delorse Lek.  Patient tried being off the shots and had issues with her allergies. She is breast feeding at this time so she is also off of her antihistamines.    PLEASE call the patient with an estimate when her vials will be ready she would like to restart shots as soon as possible.

## 2023-05-17 NOTE — Telephone Encounter (Signed)
Patient's allergy vial has been ordered.

## 2023-05-18 NOTE — Progress Notes (Signed)
EXP 05/18/24 

## 2023-05-19 DIAGNOSIS — J3081 Allergic rhinitis due to animal (cat) (dog) hair and dander: Secondary | ICD-10-CM | POA: Diagnosis not present

## 2023-05-24 DIAGNOSIS — F411 Generalized anxiety disorder: Secondary | ICD-10-CM | POA: Diagnosis not present

## 2023-05-25 NOTE — Telephone Encounter (Signed)
I called and was not able to leave a message that her vial is ready at the [facility] office. Patient is able to come in for allergy shots whenever she is ready.

## 2023-05-26 DIAGNOSIS — D2261 Melanocytic nevi of right upper limb, including shoulder: Secondary | ICD-10-CM | POA: Diagnosis not present

## 2023-05-26 DIAGNOSIS — D2271 Melanocytic nevi of right lower limb, including hip: Secondary | ICD-10-CM | POA: Diagnosis not present

## 2023-05-26 DIAGNOSIS — L509 Urticaria, unspecified: Secondary | ICD-10-CM | POA: Diagnosis not present

## 2023-05-26 DIAGNOSIS — D2262 Melanocytic nevi of left upper limb, including shoulder: Secondary | ICD-10-CM | POA: Diagnosis not present

## 2023-05-27 DIAGNOSIS — F411 Generalized anxiety disorder: Secondary | ICD-10-CM | POA: Diagnosis not present

## 2023-05-28 ENCOUNTER — Ambulatory Visit (INDEPENDENT_AMBULATORY_CARE_PROVIDER_SITE_OTHER): Payer: 59

## 2023-05-28 DIAGNOSIS — J309 Allergic rhinitis, unspecified: Secondary | ICD-10-CM

## 2023-06-07 DIAGNOSIS — F331 Major depressive disorder, recurrent, moderate: Secondary | ICD-10-CM | POA: Diagnosis not present

## 2023-06-07 DIAGNOSIS — F429 Obsessive-compulsive disorder, unspecified: Secondary | ICD-10-CM | POA: Diagnosis not present

## 2023-06-07 DIAGNOSIS — F902 Attention-deficit hyperactivity disorder, combined type: Secondary | ICD-10-CM | POA: Diagnosis not present

## 2023-06-07 DIAGNOSIS — F4310 Post-traumatic stress disorder, unspecified: Secondary | ICD-10-CM | POA: Diagnosis not present

## 2023-06-08 ENCOUNTER — Ambulatory Visit: Payer: Self-pay | Admitting: *Deleted

## 2023-06-08 DIAGNOSIS — J309 Allergic rhinitis, unspecified: Secondary | ICD-10-CM

## 2023-06-14 ENCOUNTER — Ambulatory Visit (INDEPENDENT_AMBULATORY_CARE_PROVIDER_SITE_OTHER): Payer: BC Managed Care – PPO | Admitting: *Deleted

## 2023-06-14 DIAGNOSIS — J309 Allergic rhinitis, unspecified: Secondary | ICD-10-CM | POA: Diagnosis not present

## 2023-06-21 DIAGNOSIS — D649 Anemia, unspecified: Secondary | ICD-10-CM | POA: Diagnosis not present

## 2023-06-21 DIAGNOSIS — Z13220 Encounter for screening for lipoid disorders: Secondary | ICD-10-CM | POA: Diagnosis not present

## 2023-06-21 DIAGNOSIS — Z0000 Encounter for general adult medical examination without abnormal findings: Secondary | ICD-10-CM | POA: Diagnosis not present

## 2023-06-21 DIAGNOSIS — R5383 Other fatigue: Secondary | ICD-10-CM | POA: Diagnosis not present

## 2023-06-21 DIAGNOSIS — R7309 Other abnormal glucose: Secondary | ICD-10-CM | POA: Diagnosis not present

## 2023-06-23 ENCOUNTER — Ambulatory Visit (INDEPENDENT_AMBULATORY_CARE_PROVIDER_SITE_OTHER): Payer: BC Managed Care – PPO | Admitting: *Deleted

## 2023-06-23 DIAGNOSIS — M542 Cervicalgia: Secondary | ICD-10-CM | POA: Diagnosis not present

## 2023-06-23 DIAGNOSIS — F411 Generalized anxiety disorder: Secondary | ICD-10-CM | POA: Diagnosis not present

## 2023-06-23 DIAGNOSIS — M609 Myositis, unspecified: Secondary | ICD-10-CM | POA: Diagnosis not present

## 2023-06-23 DIAGNOSIS — J309 Allergic rhinitis, unspecified: Secondary | ICD-10-CM

## 2023-06-23 DIAGNOSIS — M5383 Other specified dorsopathies, cervicothoracic region: Secondary | ICD-10-CM | POA: Diagnosis not present

## 2023-06-23 DIAGNOSIS — M9901 Segmental and somatic dysfunction of cervical region: Secondary | ICD-10-CM | POA: Diagnosis not present

## 2023-06-25 DIAGNOSIS — Z0000 Encounter for general adult medical examination without abnormal findings: Secondary | ICD-10-CM | POA: Diagnosis not present

## 2023-06-25 DIAGNOSIS — J029 Acute pharyngitis, unspecified: Secondary | ICD-10-CM | POA: Diagnosis not present

## 2023-07-02 ENCOUNTER — Ambulatory Visit: Payer: Self-pay

## 2023-07-02 DIAGNOSIS — J309 Allergic rhinitis, unspecified: Secondary | ICD-10-CM

## 2023-07-06 DIAGNOSIS — F411 Generalized anxiety disorder: Secondary | ICD-10-CM | POA: Diagnosis not present

## 2023-07-15 DIAGNOSIS — F411 Generalized anxiety disorder: Secondary | ICD-10-CM | POA: Diagnosis not present

## 2023-07-26 DIAGNOSIS — M5383 Other specified dorsopathies, cervicothoracic region: Secondary | ICD-10-CM | POA: Diagnosis not present

## 2023-07-26 DIAGNOSIS — M609 Myositis, unspecified: Secondary | ICD-10-CM | POA: Diagnosis not present

## 2023-07-26 DIAGNOSIS — M542 Cervicalgia: Secondary | ICD-10-CM | POA: Diagnosis not present

## 2023-07-26 DIAGNOSIS — M9901 Segmental and somatic dysfunction of cervical region: Secondary | ICD-10-CM | POA: Diagnosis not present

## 2023-07-27 ENCOUNTER — Ambulatory Visit (INDEPENDENT_AMBULATORY_CARE_PROVIDER_SITE_OTHER): Payer: BC Managed Care – PPO | Admitting: *Deleted

## 2023-07-27 DIAGNOSIS — J309 Allergic rhinitis, unspecified: Secondary | ICD-10-CM

## 2023-08-06 DIAGNOSIS — F4310 Post-traumatic stress disorder, unspecified: Secondary | ICD-10-CM | POA: Diagnosis not present

## 2023-08-06 DIAGNOSIS — F429 Obsessive-compulsive disorder, unspecified: Secondary | ICD-10-CM | POA: Diagnosis not present

## 2023-08-06 DIAGNOSIS — F331 Major depressive disorder, recurrent, moderate: Secondary | ICD-10-CM | POA: Diagnosis not present

## 2023-08-06 DIAGNOSIS — F902 Attention-deficit hyperactivity disorder, combined type: Secondary | ICD-10-CM | POA: Diagnosis not present

## 2023-08-12 DIAGNOSIS — M9901 Segmental and somatic dysfunction of cervical region: Secondary | ICD-10-CM | POA: Diagnosis not present

## 2023-08-12 DIAGNOSIS — M5383 Other specified dorsopathies, cervicothoracic region: Secondary | ICD-10-CM | POA: Diagnosis not present

## 2023-08-12 DIAGNOSIS — M609 Myositis, unspecified: Secondary | ICD-10-CM | POA: Diagnosis not present

## 2023-08-12 DIAGNOSIS — M542 Cervicalgia: Secondary | ICD-10-CM | POA: Diagnosis not present

## 2023-08-16 DIAGNOSIS — F411 Generalized anxiety disorder: Secondary | ICD-10-CM | POA: Diagnosis not present

## 2023-08-23 ENCOUNTER — Ambulatory Visit (INDEPENDENT_AMBULATORY_CARE_PROVIDER_SITE_OTHER): Payer: Self-pay

## 2023-08-23 DIAGNOSIS — J309 Allergic rhinitis, unspecified: Secondary | ICD-10-CM

## 2023-08-25 DIAGNOSIS — F411 Generalized anxiety disorder: Secondary | ICD-10-CM | POA: Diagnosis not present

## 2023-08-30 DIAGNOSIS — F411 Generalized anxiety disorder: Secondary | ICD-10-CM | POA: Diagnosis not present

## 2023-09-01 DIAGNOSIS — M542 Cervicalgia: Secondary | ICD-10-CM | POA: Diagnosis not present

## 2023-09-01 DIAGNOSIS — M5383 Other specified dorsopathies, cervicothoracic region: Secondary | ICD-10-CM | POA: Diagnosis not present

## 2023-09-01 DIAGNOSIS — M609 Myositis, unspecified: Secondary | ICD-10-CM | POA: Diagnosis not present

## 2023-09-01 DIAGNOSIS — M9901 Segmental and somatic dysfunction of cervical region: Secondary | ICD-10-CM | POA: Diagnosis not present

## 2023-09-13 DIAGNOSIS — R051 Acute cough: Secondary | ICD-10-CM | POA: Diagnosis not present

## 2023-09-13 DIAGNOSIS — J069 Acute upper respiratory infection, unspecified: Secondary | ICD-10-CM | POA: Diagnosis not present

## 2023-09-13 DIAGNOSIS — J209 Acute bronchitis, unspecified: Secondary | ICD-10-CM | POA: Diagnosis not present

## 2023-09-14 DIAGNOSIS — M9901 Segmental and somatic dysfunction of cervical region: Secondary | ICD-10-CM | POA: Diagnosis not present

## 2023-09-14 DIAGNOSIS — M542 Cervicalgia: Secondary | ICD-10-CM | POA: Diagnosis not present

## 2023-09-14 DIAGNOSIS — M5383 Other specified dorsopathies, cervicothoracic region: Secondary | ICD-10-CM | POA: Diagnosis not present

## 2023-09-14 DIAGNOSIS — M609 Myositis, unspecified: Secondary | ICD-10-CM | POA: Diagnosis not present

## 2023-09-21 ENCOUNTER — Ambulatory Visit (INDEPENDENT_AMBULATORY_CARE_PROVIDER_SITE_OTHER): Payer: Self-pay | Admitting: *Deleted

## 2023-09-21 ENCOUNTER — Ambulatory Visit: Payer: Self-pay | Admitting: *Deleted

## 2023-09-21 DIAGNOSIS — J309 Allergic rhinitis, unspecified: Secondary | ICD-10-CM

## 2023-09-27 DIAGNOSIS — F411 Generalized anxiety disorder: Secondary | ICD-10-CM | POA: Diagnosis not present

## 2023-10-01 ENCOUNTER — Ambulatory Visit: Payer: BC Managed Care – PPO | Admitting: Allergy

## 2023-10-06 ENCOUNTER — Other Ambulatory Visit: Payer: Self-pay

## 2023-10-06 ENCOUNTER — Ambulatory Visit (INDEPENDENT_AMBULATORY_CARE_PROVIDER_SITE_OTHER): Payer: BC Managed Care – PPO | Admitting: Allergy

## 2023-10-06 ENCOUNTER — Encounter: Payer: Self-pay | Admitting: Allergy

## 2023-10-06 VITALS — BP 110/64 | HR 89 | Temp 97.7°F | Resp 12

## 2023-10-06 DIAGNOSIS — K200 Eosinophilic esophagitis: Secondary | ICD-10-CM | POA: Diagnosis not present

## 2023-10-06 DIAGNOSIS — J4520 Mild intermittent asthma, uncomplicated: Secondary | ICD-10-CM | POA: Diagnosis not present

## 2023-10-06 DIAGNOSIS — J3089 Other allergic rhinitis: Secondary | ICD-10-CM | POA: Diagnosis not present

## 2023-10-06 DIAGNOSIS — H1013 Acute atopic conjunctivitis, bilateral: Secondary | ICD-10-CM | POA: Diagnosis not present

## 2023-10-06 DIAGNOSIS — F411 Generalized anxiety disorder: Secondary | ICD-10-CM | POA: Diagnosis not present

## 2023-10-06 MED ORDER — OLOPATADINE HCL 0.1 % OP SOLN: 1.0000 [drp] | Freq: Two times a day (BID) | OPHTHALMIC | 5 refills | Status: DC | PRN

## 2023-10-06 MED ORDER — BUDESONIDE-FORMOTEROL FUMARATE 160-4.5 MCG/ACT IN AERO: 2.0000 | INHALATION_SPRAY | Freq: Two times a day (BID) | RESPIRATORY_TRACT | 3 refills | Status: DC

## 2023-10-06 NOTE — Patient Instructions (Addendum)
Allergic rhinitis with conjunctivitis   - Continue avoidance measures   - Use Xyzal 5mg  daily as needed   - Use Patanol (olopatadine) eye drops twice daily as needed for itchy/watery eyes   - Continue allergen immunotherapy per protocol at maintenance dosing for now    EoE  - increased swallowing difficulty lately  - continue current dietary avoidance   - trial Eohilia 1 packet swallowed twice a day.   10-day sample provided.  Let us know if you would like a prescription for this particular medication.  It is budesonide made into a pre-made slurry.   Shake packet and then ingest.  Do not eat or drink for 30 minutes after  - will check on GI referral for Dr Tomasa Rand as you may need an endoscopy to assess your esophageal eosinophil load  Reactive airway   - have access to albuterol inhaler 2 puffs or 1 vial via nebulizer every 4-6 hours as needed for cough/wheeze/shortness of breath/chest tightness.  May use 15-20 minutes prior to activity.   Monitor frequency of use.      -use Symbicort 2 puffs twice a day during respiratory illnesses of flares and can use as needed (do not exceed 10 puffs per day) for cough/wheeze/shortness of breath/chest tightness.  Follow-up 4-6 months for follow-up or sooner if needed

## 2023-10-06 NOTE — Progress Notes (Signed)
Follow-up Note  RE: Patricia Harvey MRN: [id number] DOB: March 20, 1991 Date of Office Visit: 10/06/2023   History of present illness: Patricia Harvey is a 32 y.o. female presenting today for follow-up of allergic rhinitis with conjunctivitis, EOE, reactive airway.  She was last seen in the office on 01/28/23 by myself.   Discussed the use of AI scribe software for clinical note transcription with the patient, who gave verbal consent to proceed.  She reports a recent increase in swallowing difficulties, which began around March or April, and suspects it may be related to pollen allergies. The patient also notes that these symptoms have worsened postpartum. Sh has been managing her EOE symptoms with dietary modifications and occasional use of an swallowed inhaler, which she feels is not effectively delivering the medication to the esophagus. She expresses interest in trying a budesonide slurry for more direct treatment.  She also mentions a recent issue with smoke exposure, which causes her to lose her voice and experience severe throat inflammation and difficulty swallowing. She suspects this reaction may be related to her EOE.  In terms of her reactive airway, the patient uses Symbicort daily and increases usage during exercise. She also has a rescue inhaler, which she only uses when she is sick.  The patient's allergies are managed with Xyzal as needed, mainly during the pollen season. She also uses Patanol eye drops for ocular symptoms which she does find helpful. She her eye symptoms got really bad for the 2 months she stopped allergy shots and has since resumed them on a monthly basis.  Review of systems: 10pt ROS negative unless noted above in HPI   All other systems negative unless noted above in HPI  Past medical/social/surgical/family history have been reviewed and are unchanged unless specifically indicated below.  No changes  Medication List: Current Outpatient Medications  Medication  Sig Dispense Refill   albuterol (PROVENTIL) (2.5 MG/3ML) 0.083% nebulizer solution Inhale 3 mLs into the lungs every 4 (four) to 6 (six) hours as needed for wheezing. 90 mL 5   albuterol (VENTOLIN HFA) 108 (90 Base) MCG/ACT inhaler Inhale 2 puffs into the lungs every 4 (four) hours as needed for wheezing or shortness of breath. 18 g 2   albuterol (VENTOLIN HFA) 108 (90 Base) MCG/ACT inhaler INHALE 2 PUFFS INTO THE LUNGS EVERY 4 HOURS AS NEEDED FOR WHEEZING OR SHORTNESS OF BREATH. 6.7 g 1   azelastine (ASTELIN) 0.1 % nasal spray Place 2 sprays into both nostrils 2 (two) times daily. 30 mL 5   Blood Glucose Monitoring Suppl (FREESTYLE LITE) w/Device KIT Use to test blood sugar 2 times a day 1 kit 0   Bromelains 500 MG TABS Take by mouth.     budesonide-formoterol (SYMBICORT) 160-4.5 MCG/ACT inhaler Inhale 2 puffs into the lungs 2 (two) times daily. 10.2 g 3   busPIRone (BUSPAR) 10 MG tablet Take 1/2 - 1 tablet (5 - 10 mg total) by mouth 3 times daily. Take with food. 270 tablet 1   busPIRone (BUSPAR) 10 MG tablet Take 0.5-1 tablets (5-10 mg total) by mouth 3 (three) times daily (take with food). 270 tablet 1   EPINEPHrine 0.3 mg/0.3 mL IJ SOAJ injection INJECT 0.3 MG INTO THE MUSCLE ONCE FOR 1 DOSE AS NEEDED FOR LIFE THREATENING ALLERGIC REACTION. 2 each 1   glucose blood (FREESTYLE LITE) test strip Test blood sugar twice daily as directed 100 strip 0   lamoTRIgine (LAMICTAL) 100 MG tablet Take 100 mg by mouth  daily. Take one tablet daily     lamoTRIgine (LAMICTAL) 25 MG tablet Take 25 mg by mouth every morning. Take one tablet daily     Lancets (FREESTYLE) lancets Use to test blood sugar 2 times a day 100 each 0   levocetirizine (XYZAL) 5 MG tablet Take 1 tablet (5 mg total) by mouth daily as needed for allergies (Can take an extra dose during flares). 60 tablet 11   estradiol (ESTRACE) 0.1 MG/GM vaginal cream use a pea sized amount in vagina nightly (Patient not taking: Reported on 10/06/2023) 42.5 g  0   fluticasone-salmeterol (ADVAIR HFA) 115-21 MCG/ACT inhaler Inhale 2 puffs into the lungs 2 (two) times daily. (Patient not taking: Reported on 10/06/2023) 12 g 12   ipratropium-albuterol (DUONEB) 0.5-2.5 (3) MG/3ML SOLN INHALE 3 ML BY NEBULIZATION EVERY 6 HOURS AS NEEDED 90 mL 1   triamcinolone (KENALOG) 0.025 % ointment Apply to affected area 2 (two) times daily after allergy injection (Patient not taking: Reported on 10/06/2023) 80 g 1   No current facility-administered medications for this visit.     Known medication allergies: Allergies  Allergen Reactions   Egg-Derived Products Anaphylaxis   Grass Extracts [Gramineae Pollens] Itching   Msud Aid [Alitraq] Diarrhea   Other Shortness Of Breath and Other (See Comments)    Steroids, panic attack   Sesame Oil Hives, Itching and Anaphylaxis   Sesame Seed (Diagnostic) Shortness Of Breath, Diarrhea, Nausea Only and Other (See Comments)    cramping   Amphetamine-Dextroamphetamine Other (See Comments)    depression  Other Reaction(s): depressed   Methylphenidate Nausea Only and Other (See Comments)    anxiety/frustrated  Other Reaction(s): anxiety/frustrated   Milk-Related Compounds Diarrhea and Nausea And Vomiting    Cramping & bloating also   Prazosin     Syncope   Wheat Nausea Only and Other (See Comments)    Cramping, lower GI pain, nausea, constipation   [place] Other (See Comments)    Other Reaction(s): Unknown     Physical examination: Blood pressure 110/64, pulse 89, temperature 97.7 F (36.5 C), temperature source Temporal, resp. rate 12, SpO2 96%.  General: Alert, interactive, in no acute distress. HEENT: PERRLA, TMs pearly gray, turbinates non-edematous without discharge, post-pharynx non erythematous. Neck: Supple without lymphadenopathy. Lungs: Clear to auscultation without wheezing, rhonchi or rales. {no increased work of breathing. CV: Normal S1, S2 without murmurs. Abdomen: Nondistended, nontender. Skin: Warm  and dry, without lesions or rashes. Extremities:  No clubbing, cyanosis or edema. Neuro:   Grossly intact.  Diagnositics/Labs: None today  Assessment and plan: Allergic rhinitis with conjunctivitis   - Continue avoidance measures   - Use Xyzal 5mg  daily as needed   - Use Patanol (olopatadine) eye drops twice daily as needed for itchy/watery eyes   - Continue allergen immunotherapy per protocol at maintenance dosing for now    EoE  - increased swallowing difficulty lately  - continue current dietary avoidance   - trial Eohilia 1 packet swallowed twice a day.   10-day sample provided.  Let us know if you would like a prescription for this particular medication.  It is budesonide made into a pre-made slurry.   Shake packet and then ingest.  Do not eat or drink for 30 minutes after  - will check on GI referral for Dr Tomasa Rand as you may need an endoscopy to assess your esophageal eosinophil load  Reactive airway   - have access to albuterol inhaler 2 puffs or 1 vial via nebulizer  every 4-6 hours as needed for cough/wheeze/shortness of breath/chest tightness.  May use 15-20 minutes prior to activity.   Monitor frequency of use.      -use Symbicort 2 puffs twice a day during respiratory illnesses of flares and can use as needed (do not exceed 10 puffs per day) for cough/wheeze/shortness of breath/chest tightness.  Follow-up 4-6 months for follow-up or sooner if needed  I appreciate the opportunity to take part in [place] care. Please do not hesitate to contact me with questions.  Sincerely,   Margo Aye, MD Allergy/Immunology [facility]

## 2023-10-11 DIAGNOSIS — M609 Myositis, unspecified: Secondary | ICD-10-CM | POA: Diagnosis not present

## 2023-10-11 DIAGNOSIS — M542 Cervicalgia: Secondary | ICD-10-CM | POA: Diagnosis not present

## 2023-10-11 DIAGNOSIS — M5383 Other specified dorsopathies, cervicothoracic region: Secondary | ICD-10-CM | POA: Diagnosis not present

## 2023-10-11 DIAGNOSIS — M9901 Segmental and somatic dysfunction of cervical region: Secondary | ICD-10-CM | POA: Diagnosis not present

## 2023-10-11 DIAGNOSIS — F411 Generalized anxiety disorder: Secondary | ICD-10-CM | POA: Diagnosis not present

## 2023-10-11 NOTE — Progress Notes (Signed)
Their office tried contacting her twice. I will open the referral back up.   Thanks

## 2023-10-20 ENCOUNTER — Ambulatory Visit (INDEPENDENT_AMBULATORY_CARE_PROVIDER_SITE_OTHER): Payer: Self-pay | Admitting: *Deleted

## 2023-10-20 DIAGNOSIS — J309 Allergic rhinitis, unspecified: Secondary | ICD-10-CM

## 2023-10-25 DIAGNOSIS — F411 Generalized anxiety disorder: Secondary | ICD-10-CM | POA: Diagnosis not present

## 2023-10-27 DIAGNOSIS — M542 Cervicalgia: Secondary | ICD-10-CM | POA: Diagnosis not present

## 2023-10-27 DIAGNOSIS — M609 Myositis, unspecified: Secondary | ICD-10-CM | POA: Diagnosis not present

## 2023-10-27 DIAGNOSIS — M5383 Other specified dorsopathies, cervicothoracic region: Secondary | ICD-10-CM | POA: Diagnosis not present

## 2023-10-27 DIAGNOSIS — M9901 Segmental and somatic dysfunction of cervical region: Secondary | ICD-10-CM | POA: Diagnosis not present

## 2023-11-01 DIAGNOSIS — F411 Generalized anxiety disorder: Secondary | ICD-10-CM | POA: Diagnosis not present

## 2023-11-01 DIAGNOSIS — F331 Major depressive disorder, recurrent, moderate: Secondary | ICD-10-CM | POA: Diagnosis not present

## 2023-11-01 DIAGNOSIS — F4310 Post-traumatic stress disorder, unspecified: Secondary | ICD-10-CM | POA: Diagnosis not present

## 2023-11-01 DIAGNOSIS — F902 Attention-deficit hyperactivity disorder, combined type: Secondary | ICD-10-CM | POA: Diagnosis not present

## 2023-11-01 DIAGNOSIS — F429 Obsessive-compulsive disorder, unspecified: Secondary | ICD-10-CM | POA: Diagnosis not present

## 2023-11-08 DIAGNOSIS — F411 Generalized anxiety disorder: Secondary | ICD-10-CM | POA: Diagnosis not present

## 2023-11-10 DIAGNOSIS — M609 Myositis, unspecified: Secondary | ICD-10-CM | POA: Diagnosis not present

## 2023-11-10 DIAGNOSIS — M9901 Segmental and somatic dysfunction of cervical region: Secondary | ICD-10-CM | POA: Diagnosis not present

## 2023-11-10 DIAGNOSIS — F411 Generalized anxiety disorder: Secondary | ICD-10-CM | POA: Diagnosis not present

## 2023-11-10 DIAGNOSIS — M5383 Other specified dorsopathies, cervicothoracic region: Secondary | ICD-10-CM | POA: Diagnosis not present

## 2023-11-10 DIAGNOSIS — M542 Cervicalgia: Secondary | ICD-10-CM | POA: Diagnosis not present

## 2023-11-22 DIAGNOSIS — F4310 Post-traumatic stress disorder, unspecified: Secondary | ICD-10-CM | POA: Diagnosis not present

## 2023-11-22 DIAGNOSIS — F902 Attention-deficit hyperactivity disorder, combined type: Secondary | ICD-10-CM | POA: Diagnosis not present

## 2023-11-22 DIAGNOSIS — F331 Major depressive disorder, recurrent, moderate: Secondary | ICD-10-CM | POA: Diagnosis not present

## 2023-11-22 DIAGNOSIS — F429 Obsessive-compulsive disorder, unspecified: Secondary | ICD-10-CM | POA: Diagnosis not present

## 2023-12-02 ENCOUNTER — Ambulatory Visit (INDEPENDENT_AMBULATORY_CARE_PROVIDER_SITE_OTHER): Payer: BC Managed Care – PPO | Admitting: *Deleted

## 2023-12-02 DIAGNOSIS — J309 Allergic rhinitis, unspecified: Secondary | ICD-10-CM

## 2023-12-08 DIAGNOSIS — M609 Myositis, unspecified: Secondary | ICD-10-CM | POA: Diagnosis not present

## 2023-12-08 DIAGNOSIS — J3081 Allergic rhinitis due to animal (cat) (dog) hair and dander: Secondary | ICD-10-CM | POA: Diagnosis not present

## 2023-12-08 DIAGNOSIS — M5383 Other specified dorsopathies, cervicothoracic region: Secondary | ICD-10-CM | POA: Diagnosis not present

## 2023-12-08 DIAGNOSIS — M542 Cervicalgia: Secondary | ICD-10-CM | POA: Diagnosis not present

## 2023-12-08 DIAGNOSIS — M9901 Segmental and somatic dysfunction of cervical region: Secondary | ICD-10-CM | POA: Diagnosis not present

## 2023-12-09 NOTE — Progress Notes (Signed)
 VIAL EXP 12-08-24

## 2023-12-13 ENCOUNTER — Other Ambulatory Visit (HOSPITAL_COMMUNITY): Payer: Self-pay

## 2023-12-17 ENCOUNTER — Telehealth: Payer: Self-pay

## 2023-12-17 ENCOUNTER — Other Ambulatory Visit: Payer: Self-pay

## 2023-12-17 ENCOUNTER — Other Ambulatory Visit (HOSPITAL_COMMUNITY): Payer: Self-pay

## 2023-12-17 ENCOUNTER — Other Ambulatory Visit: Payer: Self-pay | Admitting: Allergy

## 2023-12-17 MED ORDER — IPRATROPIUM-ALBUTEROL 0.5-2.5 (3) MG/3ML IN SOLN
RESPIRATORY_TRACT | 1 refills | Status: AC
  Filled 2023-12-17: qty 90, 30d supply, fill #0
  Filled 2023-12-17: qty 90, 15d supply, fill #0

## 2023-12-17 MED FILL — Ipratropium-Albuterol Nebu Soln 0.5-2.5(3) MG/3ML: RESPIRATORY_TRACT | 7 days supply | Qty: 90 | Fill #0 | Status: CN

## 2023-12-17 NOTE — Telephone Encounter (Addendum)
Per Provider:  That is fine to refill duoneb (ipratropium-albuterol) 1 vial via nebulizer every 6 hours as needed for cough, wheeze, shortness of breath or chest tightness   Called patient - DOB/Pharmacy verified - advised of provider notation.  Patient verbalized understanding, no further questions.

## 2023-12-17 NOTE — Telephone Encounter (Signed)
Patient called needing a refill on ipratropium-albuterol (Duoned) she is still having issues with coughing and breathing.  Is it okay to send this into the [facility]?

## 2023-12-17 NOTE — Addendum Note (Signed)
Addended by: Areta Haber B on: 12/17/2023 04:06 PM   Modules accepted: Orders

## 2023-12-20 DIAGNOSIS — F429 Obsessive-compulsive disorder, unspecified: Secondary | ICD-10-CM | POA: Diagnosis not present

## 2023-12-20 DIAGNOSIS — F4310 Post-traumatic stress disorder, unspecified: Secondary | ICD-10-CM | POA: Diagnosis not present

## 2023-12-20 DIAGNOSIS — F902 Attention-deficit hyperactivity disorder, combined type: Secondary | ICD-10-CM | POA: Diagnosis not present

## 2023-12-20 DIAGNOSIS — F331 Major depressive disorder, recurrent, moderate: Secondary | ICD-10-CM | POA: Diagnosis not present

## 2023-12-22 DIAGNOSIS — F411 Generalized anxiety disorder: Secondary | ICD-10-CM | POA: Diagnosis not present

## 2023-12-27 DIAGNOSIS — F411 Generalized anxiety disorder: Secondary | ICD-10-CM | POA: Diagnosis not present

## 2023-12-31 ENCOUNTER — Ambulatory Visit (INDEPENDENT_AMBULATORY_CARE_PROVIDER_SITE_OTHER): Payer: BC Managed Care – PPO | Admitting: *Deleted

## 2023-12-31 DIAGNOSIS — J309 Allergic rhinitis, unspecified: Secondary | ICD-10-CM | POA: Diagnosis not present

## 2024-01-05 DIAGNOSIS — F411 Generalized anxiety disorder: Secondary | ICD-10-CM | POA: Diagnosis not present

## 2024-01-12 DIAGNOSIS — F411 Generalized anxiety disorder: Secondary | ICD-10-CM | POA: Diagnosis not present

## 2024-01-13 DIAGNOSIS — M609 Myositis, unspecified: Secondary | ICD-10-CM | POA: Diagnosis not present

## 2024-01-13 DIAGNOSIS — M542 Cervicalgia: Secondary | ICD-10-CM | POA: Diagnosis not present

## 2024-01-13 DIAGNOSIS — M5383 Other specified dorsopathies, cervicothoracic region: Secondary | ICD-10-CM | POA: Diagnosis not present

## 2024-01-13 DIAGNOSIS — M9901 Segmental and somatic dysfunction of cervical region: Secondary | ICD-10-CM | POA: Diagnosis not present

## 2024-01-19 DIAGNOSIS — F411 Generalized anxiety disorder: Secondary | ICD-10-CM | POA: Diagnosis not present

## 2024-02-01 DIAGNOSIS — M5383 Other specified dorsopathies, cervicothoracic region: Secondary | ICD-10-CM | POA: Diagnosis not present

## 2024-02-01 DIAGNOSIS — M609 Myositis, unspecified: Secondary | ICD-10-CM | POA: Diagnosis not present

## 2024-02-01 DIAGNOSIS — M542 Cervicalgia: Secondary | ICD-10-CM | POA: Diagnosis not present

## 2024-02-01 DIAGNOSIS — M9901 Segmental and somatic dysfunction of cervical region: Secondary | ICD-10-CM | POA: Diagnosis not present

## 2024-02-02 DIAGNOSIS — F411 Generalized anxiety disorder: Secondary | ICD-10-CM | POA: Diagnosis not present

## 2024-02-03 DIAGNOSIS — H1012 Acute atopic conjunctivitis, left eye: Secondary | ICD-10-CM | POA: Diagnosis not present

## 2024-02-09 DIAGNOSIS — M5383 Other specified dorsopathies, cervicothoracic region: Secondary | ICD-10-CM | POA: Diagnosis not present

## 2024-02-09 DIAGNOSIS — M609 Myositis, unspecified: Secondary | ICD-10-CM | POA: Diagnosis not present

## 2024-02-09 DIAGNOSIS — M542 Cervicalgia: Secondary | ICD-10-CM | POA: Diagnosis not present

## 2024-02-09 DIAGNOSIS — M9901 Segmental and somatic dysfunction of cervical region: Secondary | ICD-10-CM | POA: Diagnosis not present

## 2024-02-23 DIAGNOSIS — F411 Generalized anxiety disorder: Secondary | ICD-10-CM | POA: Diagnosis not present

## 2024-02-24 DIAGNOSIS — S0501XA Injury of conjunctiva and corneal abrasion without foreign body, right eye, initial encounter: Secondary | ICD-10-CM | POA: Diagnosis not present

## 2024-02-25 ENCOUNTER — Ambulatory Visit (INDEPENDENT_AMBULATORY_CARE_PROVIDER_SITE_OTHER): Payer: Self-pay

## 2024-02-25 DIAGNOSIS — J309 Allergic rhinitis, unspecified: Secondary | ICD-10-CM

## 2024-03-01 DIAGNOSIS — F411 Generalized anxiety disorder: Secondary | ICD-10-CM | POA: Diagnosis not present

## 2024-03-03 ENCOUNTER — Ambulatory Visit (INDEPENDENT_AMBULATORY_CARE_PROVIDER_SITE_OTHER)

## 2024-03-03 DIAGNOSIS — J309 Allergic rhinitis, unspecified: Secondary | ICD-10-CM

## 2024-03-08 ENCOUNTER — Ambulatory Visit (INDEPENDENT_AMBULATORY_CARE_PROVIDER_SITE_OTHER): Payer: Self-pay

## 2024-03-08 DIAGNOSIS — J309 Allergic rhinitis, unspecified: Secondary | ICD-10-CM | POA: Diagnosis not present

## 2024-03-08 DIAGNOSIS — M5383 Other specified dorsopathies, cervicothoracic region: Secondary | ICD-10-CM | POA: Diagnosis not present

## 2024-03-08 DIAGNOSIS — M542 Cervicalgia: Secondary | ICD-10-CM | POA: Diagnosis not present

## 2024-03-08 DIAGNOSIS — M9901 Segmental and somatic dysfunction of cervical region: Secondary | ICD-10-CM | POA: Diagnosis not present

## 2024-03-08 DIAGNOSIS — M609 Myositis, unspecified: Secondary | ICD-10-CM | POA: Diagnosis not present

## 2024-03-15 DIAGNOSIS — F411 Generalized anxiety disorder: Secondary | ICD-10-CM | POA: Diagnosis not present

## 2024-03-22 DIAGNOSIS — M609 Myositis, unspecified: Secondary | ICD-10-CM | POA: Diagnosis not present

## 2024-03-22 DIAGNOSIS — F411 Generalized anxiety disorder: Secondary | ICD-10-CM | POA: Diagnosis not present

## 2024-03-22 DIAGNOSIS — M9901 Segmental and somatic dysfunction of cervical region: Secondary | ICD-10-CM | POA: Diagnosis not present

## 2024-03-22 DIAGNOSIS — M542 Cervicalgia: Secondary | ICD-10-CM | POA: Diagnosis not present

## 2024-03-22 DIAGNOSIS — M5383 Other specified dorsopathies, cervicothoracic region: Secondary | ICD-10-CM | POA: Diagnosis not present

## 2024-03-29 DIAGNOSIS — F411 Generalized anxiety disorder: Secondary | ICD-10-CM | POA: Diagnosis not present

## 2024-03-30 DIAGNOSIS — M65312 Trigger thumb, left thumb: Secondary | ICD-10-CM | POA: Diagnosis not present

## 2024-03-30 DIAGNOSIS — M25551 Pain in right hip: Secondary | ICD-10-CM | POA: Diagnosis not present

## 2024-03-30 DIAGNOSIS — M25511 Pain in right shoulder: Secondary | ICD-10-CM | POA: Diagnosis not present

## 2024-03-30 DIAGNOSIS — M542 Cervicalgia: Secondary | ICD-10-CM | POA: Diagnosis not present

## 2024-04-03 ENCOUNTER — Telehealth: Payer: Self-pay | Admitting: Allergy & Immunology

## 2024-04-03 ENCOUNTER — Ambulatory Visit (INDEPENDENT_AMBULATORY_CARE_PROVIDER_SITE_OTHER): Payer: Self-pay

## 2024-04-03 DIAGNOSIS — J309 Allergic rhinitis, unspecified: Secondary | ICD-10-CM

## 2024-04-03 DIAGNOSIS — T8089XA Other complications following infusion, transfusion and therapeutic injection, initial encounter: Secondary | ICD-10-CM | POA: Diagnosis not present

## 2024-04-03 DIAGNOSIS — T450X5A Adverse effect of antiallergic and antiemetic drugs, initial encounter: Secondary | ICD-10-CM | POA: Diagnosis not present

## 2024-04-03 DIAGNOSIS — M7918 Myalgia, other site: Secondary | ICD-10-CM | POA: Diagnosis not present

## 2024-04-03 NOTE — Telephone Encounter (Signed)
 Patient called back asking that a physician give her a call about the issue below.

## 2024-04-03 NOTE — Telephone Encounter (Signed)
 I received a posted note asking me to call the patient about a shot that was placed in the wrong spot.  She did not want to speak to a nurse and requested that a physician call.  I called the patient back, per her request.  It went straight to voicemail after ringing once.  I left a voicemail explaining why I was calling.

## 2024-04-03 NOTE — Telephone Encounter (Signed)
 I am still with patients.  I will just defer to Dr. Tempie Fee since she is back in the clinic tomorrow since I have never met the patient.  Per conversation with Lucious Sabina, she was fine this morning when she talk to her.

## 2024-04-04 NOTE — Telephone Encounter (Addendum)
 Per Provider:  Please call pt and see what issues she has.   Please notify pt this is the manner of getting information to the doctor outside of an office visit.   Called patient - DOB/NEED DPR - LMOVM regarding above provider notation. Patient advised she can send provider myChart message as well regarding her concerns.  If/When patient call back - please advise of provider notation above.

## 2024-04-05 DIAGNOSIS — F411 Generalized anxiety disorder: Secondary | ICD-10-CM | POA: Diagnosis not present

## 2024-04-06 DIAGNOSIS — M25551 Pain in right hip: Secondary | ICD-10-CM | POA: Diagnosis not present

## 2024-04-06 DIAGNOSIS — M25511 Pain in right shoulder: Secondary | ICD-10-CM | POA: Diagnosis not present

## 2024-04-06 DIAGNOSIS — S0501XA Injury of conjunctiva and corneal abrasion without foreign body, right eye, initial encounter: Secondary | ICD-10-CM | POA: Diagnosis not present

## 2024-04-06 DIAGNOSIS — M542 Cervicalgia: Secondary | ICD-10-CM | POA: Diagnosis not present

## 2024-04-06 DIAGNOSIS — M65312 Trigger thumb, left thumb: Secondary | ICD-10-CM | POA: Diagnosis not present

## 2024-04-11 DIAGNOSIS — M25551 Pain in right hip: Secondary | ICD-10-CM | POA: Diagnosis not present

## 2024-04-11 DIAGNOSIS — M25511 Pain in right shoulder: Secondary | ICD-10-CM | POA: Diagnosis not present

## 2024-04-11 DIAGNOSIS — M65312 Trigger thumb, left thumb: Secondary | ICD-10-CM | POA: Diagnosis not present

## 2024-04-11 DIAGNOSIS — M542 Cervicalgia: Secondary | ICD-10-CM | POA: Diagnosis not present

## 2024-04-12 DIAGNOSIS — M5383 Other specified dorsopathies, cervicothoracic region: Secondary | ICD-10-CM | POA: Diagnosis not present

## 2024-04-12 DIAGNOSIS — M9901 Segmental and somatic dysfunction of cervical region: Secondary | ICD-10-CM | POA: Diagnosis not present

## 2024-04-12 DIAGNOSIS — M542 Cervicalgia: Secondary | ICD-10-CM | POA: Diagnosis not present

## 2024-04-12 DIAGNOSIS — M609 Myositis, unspecified: Secondary | ICD-10-CM | POA: Diagnosis not present

## 2024-04-26 DIAGNOSIS — F411 Generalized anxiety disorder: Secondary | ICD-10-CM | POA: Diagnosis not present

## 2024-04-28 DIAGNOSIS — K529 Noninfective gastroenteritis and colitis, unspecified: Secondary | ICD-10-CM | POA: Diagnosis not present

## 2024-05-03 DIAGNOSIS — F411 Generalized anxiety disorder: Secondary | ICD-10-CM | POA: Diagnosis not present

## 2024-05-04 DIAGNOSIS — H1032 Unspecified acute conjunctivitis, left eye: Secondary | ICD-10-CM | POA: Diagnosis not present

## 2024-05-04 DIAGNOSIS — M542 Cervicalgia: Secondary | ICD-10-CM | POA: Diagnosis not present

## 2024-05-04 DIAGNOSIS — M25551 Pain in right hip: Secondary | ICD-10-CM | POA: Diagnosis not present

## 2024-05-04 DIAGNOSIS — M25511 Pain in right shoulder: Secondary | ICD-10-CM | POA: Diagnosis not present

## 2024-05-04 DIAGNOSIS — M65312 Trigger thumb, left thumb: Secondary | ICD-10-CM | POA: Diagnosis not present

## 2024-05-08 DIAGNOSIS — M25551 Pain in right hip: Secondary | ICD-10-CM | POA: Diagnosis not present

## 2024-05-08 DIAGNOSIS — M542 Cervicalgia: Secondary | ICD-10-CM | POA: Diagnosis not present

## 2024-05-08 DIAGNOSIS — M25511 Pain in right shoulder: Secondary | ICD-10-CM | POA: Diagnosis not present

## 2024-05-08 DIAGNOSIS — K529 Noninfective gastroenteritis and colitis, unspecified: Secondary | ICD-10-CM | POA: Diagnosis not present

## 2024-05-08 DIAGNOSIS — M65312 Trigger thumb, left thumb: Secondary | ICD-10-CM | POA: Diagnosis not present

## 2024-05-11 DIAGNOSIS — K529 Noninfective gastroenteritis and colitis, unspecified: Secondary | ICD-10-CM | POA: Diagnosis not present

## 2024-05-15 DIAGNOSIS — M65312 Trigger thumb, left thumb: Secondary | ICD-10-CM | POA: Diagnosis not present

## 2024-05-15 DIAGNOSIS — M542 Cervicalgia: Secondary | ICD-10-CM | POA: Diagnosis not present

## 2024-05-15 DIAGNOSIS — F429 Obsessive-compulsive disorder, unspecified: Secondary | ICD-10-CM | POA: Diagnosis not present

## 2024-05-15 DIAGNOSIS — F331 Major depressive disorder, recurrent, moderate: Secondary | ICD-10-CM | POA: Diagnosis not present

## 2024-05-15 DIAGNOSIS — F4310 Post-traumatic stress disorder, unspecified: Secondary | ICD-10-CM | POA: Diagnosis not present

## 2024-05-15 DIAGNOSIS — M25551 Pain in right hip: Secondary | ICD-10-CM | POA: Diagnosis not present

## 2024-05-15 DIAGNOSIS — M25511 Pain in right shoulder: Secondary | ICD-10-CM | POA: Diagnosis not present

## 2024-05-15 DIAGNOSIS — F902 Attention-deficit hyperactivity disorder, combined type: Secondary | ICD-10-CM | POA: Diagnosis not present

## 2024-05-16 ENCOUNTER — Ambulatory Visit (INDEPENDENT_AMBULATORY_CARE_PROVIDER_SITE_OTHER)

## 2024-05-16 DIAGNOSIS — J309 Allergic rhinitis, unspecified: Secondary | ICD-10-CM | POA: Diagnosis not present

## 2024-05-16 DIAGNOSIS — R195 Other fecal abnormalities: Secondary | ICD-10-CM | POA: Diagnosis not present

## 2024-05-17 DIAGNOSIS — M25511 Pain in right shoulder: Secondary | ICD-10-CM | POA: Diagnosis not present

## 2024-05-17 DIAGNOSIS — M65312 Trigger thumb, left thumb: Secondary | ICD-10-CM | POA: Diagnosis not present

## 2024-05-17 DIAGNOSIS — M542 Cervicalgia: Secondary | ICD-10-CM | POA: Diagnosis not present

## 2024-05-17 DIAGNOSIS — M25551 Pain in right hip: Secondary | ICD-10-CM | POA: Diagnosis not present

## 2024-05-18 DIAGNOSIS — M542 Cervicalgia: Secondary | ICD-10-CM | POA: Diagnosis not present

## 2024-05-18 DIAGNOSIS — L309 Dermatitis, unspecified: Secondary | ICD-10-CM | POA: Diagnosis not present

## 2024-05-18 DIAGNOSIS — M5383 Other specified dorsopathies, cervicothoracic region: Secondary | ICD-10-CM | POA: Diagnosis not present

## 2024-05-18 DIAGNOSIS — M9901 Segmental and somatic dysfunction of cervical region: Secondary | ICD-10-CM | POA: Diagnosis not present

## 2024-05-18 DIAGNOSIS — M609 Myositis, unspecified: Secondary | ICD-10-CM | POA: Diagnosis not present

## 2024-05-19 DIAGNOSIS — M65312 Trigger thumb, left thumb: Secondary | ICD-10-CM | POA: Diagnosis not present

## 2024-05-19 DIAGNOSIS — M25551 Pain in right hip: Secondary | ICD-10-CM | POA: Diagnosis not present

## 2024-05-19 DIAGNOSIS — M542 Cervicalgia: Secondary | ICD-10-CM | POA: Diagnosis not present

## 2024-05-19 DIAGNOSIS — M25511 Pain in right shoulder: Secondary | ICD-10-CM | POA: Diagnosis not present

## 2024-05-25 DIAGNOSIS — F4310 Post-traumatic stress disorder, unspecified: Secondary | ICD-10-CM | POA: Diagnosis not present

## 2024-05-25 DIAGNOSIS — F331 Major depressive disorder, recurrent, moderate: Secondary | ICD-10-CM | POA: Diagnosis not present

## 2024-05-25 DIAGNOSIS — F902 Attention-deficit hyperactivity disorder, combined type: Secondary | ICD-10-CM | POA: Diagnosis not present

## 2024-05-25 DIAGNOSIS — F429 Obsessive-compulsive disorder, unspecified: Secondary | ICD-10-CM | POA: Diagnosis not present

## 2024-05-29 DIAGNOSIS — M25511 Pain in right shoulder: Secondary | ICD-10-CM | POA: Diagnosis not present

## 2024-05-29 DIAGNOSIS — M25551 Pain in right hip: Secondary | ICD-10-CM | POA: Diagnosis not present

## 2024-05-29 DIAGNOSIS — M542 Cervicalgia: Secondary | ICD-10-CM | POA: Diagnosis not present

## 2024-05-29 DIAGNOSIS — M65312 Trigger thumb, left thumb: Secondary | ICD-10-CM | POA: Diagnosis not present

## 2024-05-31 DIAGNOSIS — M25511 Pain in right shoulder: Secondary | ICD-10-CM | POA: Diagnosis not present

## 2024-05-31 DIAGNOSIS — M25551 Pain in right hip: Secondary | ICD-10-CM | POA: Diagnosis not present

## 2024-05-31 DIAGNOSIS — M542 Cervicalgia: Secondary | ICD-10-CM | POA: Diagnosis not present

## 2024-05-31 DIAGNOSIS — M65312 Trigger thumb, left thumb: Secondary | ICD-10-CM | POA: Diagnosis not present

## 2024-05-31 DIAGNOSIS — F411 Generalized anxiety disorder: Secondary | ICD-10-CM | POA: Diagnosis not present

## 2024-06-05 DIAGNOSIS — M25511 Pain in right shoulder: Secondary | ICD-10-CM | POA: Diagnosis not present

## 2024-06-05 DIAGNOSIS — M65312 Trigger thumb, left thumb: Secondary | ICD-10-CM | POA: Diagnosis not present

## 2024-06-05 DIAGNOSIS — M25551 Pain in right hip: Secondary | ICD-10-CM | POA: Diagnosis not present

## 2024-06-05 DIAGNOSIS — M542 Cervicalgia: Secondary | ICD-10-CM | POA: Diagnosis not present

## 2024-06-08 DIAGNOSIS — K200 Eosinophilic esophagitis: Secondary | ICD-10-CM | POA: Diagnosis not present

## 2024-06-08 DIAGNOSIS — R194 Change in bowel habit: Secondary | ICD-10-CM | POA: Diagnosis not present

## 2024-06-09 DIAGNOSIS — M25551 Pain in right hip: Secondary | ICD-10-CM | POA: Diagnosis not present

## 2024-06-09 DIAGNOSIS — M65312 Trigger thumb, left thumb: Secondary | ICD-10-CM | POA: Diagnosis not present

## 2024-06-09 DIAGNOSIS — M25511 Pain in right shoulder: Secondary | ICD-10-CM | POA: Diagnosis not present

## 2024-06-09 DIAGNOSIS — M542 Cervicalgia: Secondary | ICD-10-CM | POA: Diagnosis not present

## 2024-06-12 ENCOUNTER — Ambulatory Visit (INDEPENDENT_AMBULATORY_CARE_PROVIDER_SITE_OTHER)

## 2024-06-12 DIAGNOSIS — F411 Generalized anxiety disorder: Secondary | ICD-10-CM | POA: Diagnosis not present

## 2024-06-12 DIAGNOSIS — J309 Allergic rhinitis, unspecified: Secondary | ICD-10-CM

## 2024-06-13 DIAGNOSIS — M65312 Trigger thumb, left thumb: Secondary | ICD-10-CM | POA: Diagnosis not present

## 2024-06-13 DIAGNOSIS — F411 Generalized anxiety disorder: Secondary | ICD-10-CM | POA: Diagnosis not present

## 2024-06-13 DIAGNOSIS — M25511 Pain in right shoulder: Secondary | ICD-10-CM | POA: Diagnosis not present

## 2024-06-13 DIAGNOSIS — J3081 Allergic rhinitis due to animal (cat) (dog) hair and dander: Secondary | ICD-10-CM | POA: Diagnosis not present

## 2024-06-13 DIAGNOSIS — M542 Cervicalgia: Secondary | ICD-10-CM | POA: Diagnosis not present

## 2024-06-13 DIAGNOSIS — M25551 Pain in right hip: Secondary | ICD-10-CM | POA: Diagnosis not present

## 2024-06-13 NOTE — Progress Notes (Signed)
 VIAL MADE 06-13-24

## 2024-06-15 DIAGNOSIS — F331 Major depressive disorder, recurrent, moderate: Secondary | ICD-10-CM | POA: Diagnosis not present

## 2024-06-15 DIAGNOSIS — F411 Generalized anxiety disorder: Secondary | ICD-10-CM | POA: Diagnosis not present

## 2024-06-15 DIAGNOSIS — F4310 Post-traumatic stress disorder, unspecified: Secondary | ICD-10-CM | POA: Diagnosis not present

## 2024-06-15 DIAGNOSIS — F429 Obsessive-compulsive disorder, unspecified: Secondary | ICD-10-CM | POA: Diagnosis not present

## 2024-06-16 DIAGNOSIS — M25511 Pain in right shoulder: Secondary | ICD-10-CM | POA: Diagnosis not present

## 2024-06-16 DIAGNOSIS — M542 Cervicalgia: Secondary | ICD-10-CM | POA: Diagnosis not present

## 2024-06-16 DIAGNOSIS — M25551 Pain in right hip: Secondary | ICD-10-CM | POA: Diagnosis not present

## 2024-06-16 DIAGNOSIS — M65312 Trigger thumb, left thumb: Secondary | ICD-10-CM | POA: Diagnosis not present

## 2024-06-19 DIAGNOSIS — M65312 Trigger thumb, left thumb: Secondary | ICD-10-CM | POA: Diagnosis not present

## 2024-06-19 DIAGNOSIS — M25511 Pain in right shoulder: Secondary | ICD-10-CM | POA: Diagnosis not present

## 2024-06-19 DIAGNOSIS — F411 Generalized anxiety disorder: Secondary | ICD-10-CM | POA: Diagnosis not present

## 2024-06-19 DIAGNOSIS — M25551 Pain in right hip: Secondary | ICD-10-CM | POA: Diagnosis not present

## 2024-06-19 DIAGNOSIS — M542 Cervicalgia: Secondary | ICD-10-CM | POA: Diagnosis not present

## 2024-06-28 DIAGNOSIS — F411 Generalized anxiety disorder: Secondary | ICD-10-CM | POA: Diagnosis not present

## 2024-06-29 DIAGNOSIS — M25551 Pain in right hip: Secondary | ICD-10-CM | POA: Diagnosis not present

## 2024-06-29 DIAGNOSIS — F902 Attention-deficit hyperactivity disorder, combined type: Secondary | ICD-10-CM | POA: Diagnosis not present

## 2024-06-29 DIAGNOSIS — F429 Obsessive-compulsive disorder, unspecified: Secondary | ICD-10-CM | POA: Diagnosis not present

## 2024-06-29 DIAGNOSIS — M25511 Pain in right shoulder: Secondary | ICD-10-CM | POA: Diagnosis not present

## 2024-06-29 DIAGNOSIS — K200 Eosinophilic esophagitis: Secondary | ICD-10-CM | POA: Diagnosis not present

## 2024-06-29 DIAGNOSIS — F411 Generalized anxiety disorder: Secondary | ICD-10-CM | POA: Diagnosis not present

## 2024-06-29 DIAGNOSIS — Z13220 Encounter for screening for lipoid disorders: Secondary | ICD-10-CM | POA: Diagnosis not present

## 2024-06-29 DIAGNOSIS — F4310 Post-traumatic stress disorder, unspecified: Secondary | ICD-10-CM | POA: Diagnosis not present

## 2024-06-29 DIAGNOSIS — F331 Major depressive disorder, recurrent, moderate: Secondary | ICD-10-CM | POA: Diagnosis not present

## 2024-06-29 DIAGNOSIS — Z0000 Encounter for general adult medical examination without abnormal findings: Secondary | ICD-10-CM | POA: Diagnosis not present

## 2024-06-29 DIAGNOSIS — M542 Cervicalgia: Secondary | ICD-10-CM | POA: Diagnosis not present

## 2024-06-29 DIAGNOSIS — M65312 Trigger thumb, left thumb: Secondary | ICD-10-CM | POA: Diagnosis not present

## 2024-07-12 DIAGNOSIS — F411 Generalized anxiety disorder: Secondary | ICD-10-CM | POA: Diagnosis not present

## 2024-07-20 ENCOUNTER — Ambulatory Visit (INDEPENDENT_AMBULATORY_CARE_PROVIDER_SITE_OTHER)

## 2024-07-20 DIAGNOSIS — J309 Allergic rhinitis, unspecified: Secondary | ICD-10-CM | POA: Diagnosis not present

## 2024-07-27 DIAGNOSIS — F411 Generalized anxiety disorder: Secondary | ICD-10-CM | POA: Diagnosis not present

## 2024-08-01 ENCOUNTER — Telehealth: Payer: Self-pay

## 2024-08-01 NOTE — Telephone Encounter (Signed)
 Called patient - DOB/NEED DPR verified - advised 4-6 mth f/u appt needed to completed form faxed by [employer] for classic queen mattress protector and 2 pillow protectors.  Scheduled; 09/06/24 @ 10 am w/Dr. Jeneal - [place] office.  Patient verbalized understanding to all, no further questions.  Form will be at back nurse's station in pending forms folder.

## 2024-08-28 ENCOUNTER — Ambulatory Visit (INDEPENDENT_AMBULATORY_CARE_PROVIDER_SITE_OTHER)

## 2024-08-28 DIAGNOSIS — J309 Allergic rhinitis, unspecified: Secondary | ICD-10-CM | POA: Diagnosis not present

## 2024-09-01 ENCOUNTER — Other Ambulatory Visit: Payer: Self-pay | Admitting: Nurse Practitioner

## 2024-09-01 DIAGNOSIS — Z1231 Encounter for screening mammogram for malignant neoplasm of breast: Secondary | ICD-10-CM

## 2024-09-06 ENCOUNTER — Other Ambulatory Visit: Payer: Self-pay

## 2024-09-06 ENCOUNTER — Ambulatory Visit (INDEPENDENT_AMBULATORY_CARE_PROVIDER_SITE_OTHER): Admitting: Allergy

## 2024-09-06 ENCOUNTER — Encounter: Payer: Self-pay | Admitting: Allergy

## 2024-09-06 ENCOUNTER — Ambulatory Visit: Admitting: Allergy

## 2024-09-06 VITALS — BP 92/70 | HR 78 | Temp 98.3°F

## 2024-09-06 DIAGNOSIS — J3089 Other allergic rhinitis: Secondary | ICD-10-CM | POA: Diagnosis not present

## 2024-09-06 DIAGNOSIS — J4520 Mild intermittent asthma, uncomplicated: Secondary | ICD-10-CM

## 2024-09-06 DIAGNOSIS — K200 Eosinophilic esophagitis: Secondary | ICD-10-CM

## 2024-09-06 DIAGNOSIS — H1013 Acute atopic conjunctivitis, bilateral: Secondary | ICD-10-CM

## 2024-09-06 MED ORDER — ALBUTEROL SULFATE HFA 108 (90 BASE) MCG/ACT IN AERS
2.0000 | INHALATION_SPRAY | RESPIRATORY_TRACT | 2 refills | Status: AC | PRN
Start: 2024-09-06 — End: ?

## 2024-09-06 MED ORDER — OLOPATADINE HCL 0.1 % OP SOLN: 1.0000 [drp] | Freq: Two times a day (BID) | OPHTHALMIC | 5 refills | Status: AC | PRN

## 2024-09-06 MED ORDER — ALBUTEROL SULFATE (2.5 MG/3ML) 0.083% IN NEBU: 3.0000 mL | INHALATION_SOLUTION | RESPIRATORY_TRACT | 5 refills | Status: AC | PRN

## 2024-09-06 MED ORDER — AZELASTINE HCL 0.1 % NA SOLN: 2.0000 | Freq: Two times a day (BID) | NASAL | 5 refills | Status: AC

## 2024-09-06 MED ORDER — BUDESONIDE-FORMOTEROL FUMARATE 160-4.5 MCG/ACT IN AERO: 2.0000 | INHALATION_SPRAY | Freq: Two times a day (BID) | RESPIRATORY_TRACT | 3 refills | Status: AC

## 2024-09-06 MED ORDER — IPRATROPIUM BROMIDE 0.06 % NA SOLN: 2.0000 | Freq: Three times a day (TID) | NASAL | 5 refills | Status: AC | PRN

## 2024-09-06 NOTE — Patient Instructions (Addendum)
 Allergic rhinitis with conjunctivitis   - Continue allergen avoidance measures   - Use Xyzal  5mg  daily as needed   - Use Patanol (olopatadine ) eye drops twice daily as needed for itchy/watery eyes   - Use azelastine  1 to 2 sprays each nostril twice a day as needed for runny nose.  This is more of a maintenance nasal drainage spray   -Use ipratropium spray 2 sprays each nostril up to 4 times a day as needed for nasal drainage/congestion.  This can also be used during respiratory infections/illnesses.   - Continue allergen immunotherapy per protocol at maintenance dosing for now   - Will obtain updated environmental allergy  panel today and compared to last in 2019 to see if you have lost reactivity to allergens and your allergy  shots.  This will help to determine when you should be able to discontinue allergy  shots.    EoE  - Medical release of record completed today so we can obtain the endoscopy reports from your GI to see the eosinophil count of the esophagus  - continue current dietary avoidance   - Discussed therapeutic options for EOE control.  Will use swallowed budesonide  slurry if needed if symptoms are worsening or if endoscopy shows continued disease  - Continue proton pump inhibitor (acid reflux medicine) as directed by GI  - Continue routine follow-up care with your GI doctor  Reactive airway   - have access to albuterol  inhaler 2 puffs or 1 vial via nebulizer every 4-6 hours as needed for cough/wheeze/shortness of breath/chest tightness.  May use 15-20 minutes prior to activity.   Monitor frequency of use.      -use Symbicort  2 puffs twice a day during respiratory illnesses of flares and can use as needed (do not exceed 10 puffs per day) for cough/wheeze/shortness of breath/chest tightness.  Follow-up 6-12 months for follow-up or sooner if needed

## 2024-09-06 NOTE — Progress Notes (Signed)
 Follow-up Note  RE: Patricia Harvey MRN: [id number] DOB: 09-08-1991 Date of Office Visit: 09/06/2024   History of present illness: Patricia Harvey is a 33 y.o. female presenting today for follow-up of EOE, allergic rhinitis with conjunctivitis and reactive airway. She was last seen in the office on 10/06/23 by myself.    Discussed the use of AI scribe software for clinical note transcription with the patient, who gave verbal consent to proceed.  She has a history of eosinophilic esophagitis and recently underwent an endoscopy this week to evaluate her condition. The results are pending.  She reports slight improvement in throat/swallowing symptoms, even before restarting omeprazole for acid reflux, which she takes about five out of seven days a week. She continues to experience food symptoms, particularly with sauces and oils, which are difficult to avoid when dining out. Since restarting omeprazole, she can better differentiate between sensations of food getting stuck versus acid reflux, noting less obstruction than previously thought. In regards to [facility] management she does prefer to use swallowed budesonide  over biologics due to her concerns about systemic effects.  After her last visit I did provide her with samples of Eohilia  which she did try a pack and did not really tolerate this.  She previously was doing a great job regards to dietary modifications versus EOE management.  After having the endoscopy she did note that her eyes and nose were running for two days post-procedure.  She notified the GI office in regards to this and was told that one of the cannulas in the nostril was oxygen and the other cannula in the nose was a CO detector.  The side of the nose that she is having more symptoms from our the oxygen administered side.  She has been taking Zyrtec and nasal sprays azelastine  right now as she does need a refill of the ipratropium spray.   She has a history of environmental allergy  and is  on allergy  shots, which she finds effective in reducing her need for other medications. She attempted to stop them about a year and a half ago around the time she was conceiving/pregnant but experienced a resurgence of symptoms and did resume shots after delivery. She is considering retesting to evaluate her current allergy  status.  She is planning to undergo IVF again in the future and is mindful of medication use during pregnancy. She has concerns about the impact of medications on her children's dental health, as her son has experienced significant dental issues.     Review of systems: 10pt ROS negative unless noted above in HPI  Past medical/social/surgical/family history have been reviewed and are unchanged unless specifically indicated below.  No changes  Medication List: Current Outpatient Medications  Medication Sig Dispense Refill   albuterol  (PROVENTIL ) (2.5 MG/3ML) 0.083% nebulizer solution Inhale 3 mLs into the lungs every 4 (four) to 6 (six) hours as needed for wheezing. 90 mL 5   albuterol  (VENTOLIN  HFA) 108 (90 Base) MCG/ACT inhaler Inhale 2 puffs into the lungs every 4 (four) hours as needed for wheezing or shortness of breath. 18 g 2   albuterol  (VENTOLIN  HFA) 108 (90 Base) MCG/ACT inhaler INHALE 2 PUFFS INTO THE LUNGS EVERY 4 HOURS AS NEEDED FOR WHEEZING OR SHORTNESS OF BREATH. 6.7 g 1   azelastine  (ASTELIN ) 0.1 % nasal spray Place 2 sprays into both nostrils 2 (two) times daily. 30 mL 5   Blood Glucose Monitoring Suppl (FREESTYLE LITE) w/Device KIT Use to test blood sugar 2 times  a day 1 kit 0   Bromelains 500 MG TABS Take by mouth.     budesonide -formoterol  (SYMBICORT ) 160-4.5 MCG/ACT inhaler Inhale 2 puffs into the lungs 2 (two) times daily. 10.2 g 3   busPIRone  (BUSPAR ) 10 MG tablet Take 1/2 - 1 tablet (5 - 10 mg total) by mouth 3 times daily. Take with food. 270 tablet 1   busPIRone  (BUSPAR ) 10 MG tablet Take 0.5-1 tablets (5-10 mg total) by mouth 3 (three) times daily  (take with food). 270 tablet 1   EPINEPHrine  0.3 mg/0.3 mL IJ SOAJ injection INJECT 0.3 MG INTO THE MUSCLE ONCE FOR 1 DOSE AS NEEDED FOR LIFE THREATENING ALLERGIC REACTION. 2 each 1   estradiol  (ESTRACE ) 0.1 MG/GM vaginal cream use a pea sized amount in vagina nightly (Patient not taking: Reported on 09/06/2024) 42.5 g 0   fluticasone -salmeterol (ADVAIR  HFA) 115-21 MCG/ACT inhaler Inhale 2 puffs into the lungs 2 (two) times daily. (Patient not taking: Reported on 09/06/2024) 12 g 12   glucose blood (FREESTYLE LITE) test strip Test blood sugar twice daily as directed 100 strip 0   ipratropium-albuterol  (DUONEB) 0.5-2.5 (3) MG/3ML SOLN 1 vial (3 mL) via nebulizer every 6 hours as needed for cough, wheeze, shortness of breath or chest tightness. 90 mL 1   lamoTRIgine (LAMICTAL) 100 MG tablet Take 100 mg by mouth daily. Take one tablet daily     lamoTRIgine (LAMICTAL) 25 MG tablet Take 25 mg by mouth every morning. Take one tablet daily     Lancets (FREESTYLE) lancets Use to test blood sugar 2 times a day 100 each 0   levocetirizine (XYZAL ) 5 MG tablet Take 1 tablet (5 mg total) by mouth daily as needed for allergies (Can take an extra dose during flares). 60 tablet 11   olopatadine  (PATANOL) 0.1 % ophthalmic solution Place 1 drop into both eyes 2 (two) times daily as needed for allergies. 5 mL 5   triamcinolone  (KENALOG ) 0.025 % ointment Apply to affected area 2 (two) times daily after allergy  injection (Patient not taking: Reported on 09/06/2024) 80 g 1   No current facility-administered medications for this visit.     Known medication allergies: Allergies  Allergen Reactions   Egg-Derived Products Anaphylaxis   Grass Extracts [Gramineae Pollens] Itching   Msud Aid [Alitraq] Diarrhea   Other Shortness Of Breath and Other (See Comments)    Steroids, panic attack   Sesame Oil Hives, Itching and Anaphylaxis   Sesame Seed (Diagnostic) Shortness Of Breath, Diarrhea, Nausea Only and Other (See  Comments)    cramping   Amphetamine-Dextroamphetamine Other (See Comments)    depression  Other Reaction(s): depressed   Methylphenidate  Nausea Only and Other (See Comments)    anxiety/frustrated  Other Reaction(s): anxiety/frustrated   Milk-Related Compounds Diarrhea and Nausea And Vomiting    Cramping & bloating also   Prazosin     Syncope   Wheat Nausea Only and Other (See Comments)    Cramping, lower GI pain, nausea, constipation   Whey Other (See Comments)    Other Reaction(s): Unknown     Physical examination: Blood pressure 92/70, pulse 78, temperature 98.3 F (36.8 C), SpO2 99%.  General: Alert, interactive, in no acute distress. HEENT: PERRLA, TMs pearly gray, turbinates moderately edematous with clear discharge L>R, post-pharynx non erythematous. Neck: Supple without lymphadenopathy. Lungs: Clear to auscultation without wheezing, rhonchi or rales. {no increased work of breathing. CV: Normal S1, S2 without murmurs. Abdomen: Nondistended, nontender. Skin: Warm and dry, without lesions or  rashes. Extremities:  No clubbing, cyanosis or edema. Neuro:   Grossly intact.  Diagnostics/Labs:  Spirometry: FEV1: 2.87 L 109%, FVC: 3.48 L or 112%, ratio consistent with nonobstructive pattern  Assessment and plan:   Allergic rhinitis with conjunctivitis   - Continue allergen avoidance measures   - Use Xyzal  5mg  daily as needed   - Use Patanol (olopatadine ) eye drops twice daily as needed for itchy/watery eyes   - Use azelastine  1 to 2 sprays each nostril twice a day as needed for runny nose.  This is more of a maintenance nasal drainage spray   -Use ipratropium spray 2 sprays each nostril up to 4 times a day as needed for nasal drainage/congestion.  This can also be used during respiratory infections/illnesses.   - Continue allergen immunotherapy per protocol at maintenance dosing for now   - Will obtain updated environmental allergy  panel today and compared to last in 2019  to see if you have lost reactivity to allergens and your allergy  shots.  This will help to determine when you should be able to discontinue allergy  shots.    EoE  - Medical release of record completed today so we can obtain the endoscopy reports from your GI to see the eosinophil count of the esophagus  - continue current dietary avoidance   - Discussed therapeutic options for EOE control.  Will use swallowed budesonide  slurry if needed if symptoms are worsening or if endoscopy shows continued disease  - Continue proton pump inhibitor (acid reflux medicine) as directed by GI  - Continue routine follow-up care with your GI doctor  Reactive airway   - have access to albuterol  inhaler 2 puffs or 1 vial via nebulizer every 4-6 hours as needed for cough/wheeze/shortness of breath/chest tightness.  May use 15-20 minutes prior to activity.   Monitor frequency of use.      -use Symbicort  2 puffs twice a day during respiratory illnesses of flares and can use as needed (do not exceed 10 puffs per day) for cough/wheeze/shortness of breath/chest tightness.  Follow-up 6-12 months for follow-up or sooner if needed  I appreciate the opportunity to take part in [place] care. Please do not hesitate to contact me with questions.  Sincerely,   Danita Brain, MD Allergy /Immunology [facility]

## 2024-09-09 LAB — ALLERGENS W/TOTAL IGE AREA 2
Alternaria Alternata IgE: <0.1 kU/L
Aspergillus Fumigatus IgE: <0.1 kU/L
Bermuda Grass IgE: <0.1 kU/L
Cat Dander IgE: <0.1 kU/L
Cedar, Mountain IgE: <0.1 kU/L
Cladosporium Herbarum IgE: <0.1 kU/L
Cockroach, German IgE: <0.1 kU/L
Common Silver Birch IgE: 0.12 kU/L — AB
Cottonwood IgE: 0.28 kU/L — AB
D Farinae IgE: <0.1 kU/L
D Pteronyssinus IgE: <0.1 kU/L
Dog Dander IgE: <0.1 kU/L
Elm, American IgE: 0.37 kU/L — AB
IgE (Immunoglobulin E), Serum: 35 [IU]/mL (ref 6–495)
Johnson Grass IgE: <0.1 kU/L
Maple/Box Elder IgE: 0.2 kU/L — AB
Mouse Urine IgE: <0.1 kU/L
Oak, White IgE: <0.1 kU/L
Pecan, Hickory IgE: 1.55 kU/L — AB
Penicillium Chrysogen IgE: <0.1 kU/L
Pigweed, Rough IgE: <0.1 kU/L
Ragweed, Short IgE: 0.12 kU/L — AB
Sheep Sorrel IgE Qn: <0.1 kU/L
Timothy Grass IgE: <0.1 kU/L
White Mulberry IgE: 0.1 kU/L — AB

## 2024-09-12 ENCOUNTER — Ambulatory Visit

## 2024-09-13 ENCOUNTER — Telehealth: Payer: Self-pay | Admitting: Allergy

## 2024-09-13 NOTE — Telephone Encounter (Signed)
 Looks like labs have resulted, waiting for interpretation.

## 2024-09-13 NOTE — Telephone Encounter (Signed)
 Patricia Harvey is requesting someone on the clinical staff call her to discuss her labs, and the possible changing of her allergy  injection vials as per her discussion with Dr. Jeneal.

## 2024-09-14 ENCOUNTER — Ambulatory Visit
Admission: RE | Admit: 2024-09-14 | Discharge: 2024-09-14 | Disposition: A | Discharge status: Home / Self Care | Source: Ambulatory Visit | Attending: Nurse Practitioner | Admitting: Nurse Practitioner

## 2024-09-14 DIAGNOSIS — Z1231 Encounter for screening mammogram for malignant neoplasm of breast: Secondary | ICD-10-CM

## 2024-09-15 ENCOUNTER — Ambulatory Visit: Payer: Self-pay | Admitting: Allergy

## 2024-09-15 NOTE — Telephone Encounter (Signed)
 Patient called the office to check on her telephone message from Wednesday. She was informed of the results from Dr. Dulcie. She decided that she would like to go with the second option of doing the [place]/Pecan combination vials. She would like to know what schedule she will be on when this starts.

## 2024-09-20 ENCOUNTER — Other Ambulatory Visit: Payer: Self-pay | Admitting: Nurse Practitioner

## 2024-09-20 DIAGNOSIS — R928 Other abnormal and inconclusive findings on diagnostic imaging of breast: Secondary | ICD-10-CM

## 2024-09-21 ENCOUNTER — Ambulatory Visit
Admission: RE | Admit: 2024-09-21 | Discharge: 2024-09-21 | Disposition: A | Discharge status: Home / Self Care | Source: Ambulatory Visit | Attending: Nurse Practitioner | Admitting: Nurse Practitioner

## 2024-09-21 ENCOUNTER — Encounter

## 2024-09-21 ENCOUNTER — Other Ambulatory Visit: Payer: Self-pay | Admitting: Nurse Practitioner

## 2024-09-21 DIAGNOSIS — R921 Mammographic calcification found on diagnostic imaging of breast: Secondary | ICD-10-CM

## 2024-09-21 DIAGNOSIS — R928 Other abnormal and inconclusive findings on diagnostic imaging of breast: Secondary | ICD-10-CM

## 2024-09-25 ENCOUNTER — Ambulatory Visit
Admission: RE | Admit: 2024-09-25 | Discharge: 2024-09-25 | Disposition: A | Discharge status: Home / Self Care | Source: Ambulatory Visit | Attending: Nurse Practitioner | Admitting: Nurse Practitioner

## 2024-09-25 ENCOUNTER — Ambulatory Visit
Admission: RE | Admit: 2024-09-25 | Discharge: 2024-09-25 | Disposition: A | Source: Ambulatory Visit | Attending: Nurse Practitioner

## 2024-09-25 DIAGNOSIS — R921 Mammographic calcification found on diagnostic imaging of breast: Secondary | ICD-10-CM

## 2024-09-25 HISTORY — PX: BREAST BIOPSY: SHX20

## 2024-09-26 LAB — SURGICAL PATHOLOGY

## 2024-09-29 ENCOUNTER — Other Ambulatory Visit: Payer: Self-pay | Admitting: Allergy

## 2024-09-29 DIAGNOSIS — J3089 Other allergic rhinitis: Secondary | ICD-10-CM

## 2024-09-29 DIAGNOSIS — H1013 Acute atopic conjunctivitis, bilateral: Secondary | ICD-10-CM

## 2024-10-02 DIAGNOSIS — J301 Allergic rhinitis due to pollen: Secondary | ICD-10-CM | POA: Diagnosis not present

## 2024-10-02 NOTE — Progress Notes (Signed)
 Aeroallergen Immunotherapy  Ordering Provider: Dr. Danita Brain  Patient Details Name: Patricia Harvey MRN: [id number] Date of Birth: 1990/12/29  Order 1 of 1  Vial Label: pollen  0.2 ml (Volume)  1:10 Concentration -- Hickory* 0.2 ml (Volume)  1:10 Concentration -- Pecan Pollen   0.4  ml Extract Subtotal 4.6  ml Diluent  5.0  ml Maintenance Total  Schedule:  C  Blue Vial (1:100,000): Schedule C (5 doses) Yellow Vial (1:10,000): Schedule C (5 doses) Green Vial (1:1,000): Schedule C (5 doses) Red Vial (1:100): Schedule A (14 doses)  Special Instructions: After completion of the first Red Vial, please space to every two weeks. After completion of the second Red Vial, please space to every 4 weeks. Ok to up dose new vials at 0.53mL --> 0.3 mL --> 0.5 mL.  Ok to come twice weekly, if desired, as long as there is 48 hours between injections.

## 2024-10-02 NOTE — Progress Notes (Signed)
 VIAL MADE ON 10/02/24

## 2024-10-09 ENCOUNTER — Ambulatory Visit (INDEPENDENT_AMBULATORY_CARE_PROVIDER_SITE_OTHER)

## 2024-10-09 DIAGNOSIS — J309 Allergic rhinitis, unspecified: Secondary | ICD-10-CM

## 2024-11-07 ENCOUNTER — Ambulatory Visit

## 2024-11-07 DIAGNOSIS — J309 Allergic rhinitis, unspecified: Secondary | ICD-10-CM

## 2024-11-10 ENCOUNTER — Ambulatory Visit

## 2024-11-10 DIAGNOSIS — J309 Allergic rhinitis, unspecified: Secondary | ICD-10-CM

## 2024-11-13 ENCOUNTER — Ambulatory Visit

## 2024-11-13 DIAGNOSIS — J309 Allergic rhinitis, unspecified: Secondary | ICD-10-CM

## 2024-11-15 ENCOUNTER — Ambulatory Visit (INDEPENDENT_AMBULATORY_CARE_PROVIDER_SITE_OTHER)

## 2024-11-15 DIAGNOSIS — J309 Allergic rhinitis, unspecified: Secondary | ICD-10-CM | POA: Diagnosis not present

## 2024-11-20 ENCOUNTER — Ambulatory Visit

## 2024-11-20 DIAGNOSIS — J309 Allergic rhinitis, unspecified: Secondary | ICD-10-CM

## 2024-11-28 ENCOUNTER — Ambulatory Visit (INDEPENDENT_AMBULATORY_CARE_PROVIDER_SITE_OTHER)

## 2024-11-28 DIAGNOSIS — J309 Allergic rhinitis, unspecified: Secondary | ICD-10-CM

## 2024-12-01 ENCOUNTER — Ambulatory Visit

## 2024-12-01 DIAGNOSIS — J309 Allergic rhinitis, unspecified: Secondary | ICD-10-CM

## 2024-12-05 DIAGNOSIS — J309 Allergic rhinitis, unspecified: Secondary | ICD-10-CM

## 2024-12-07 ENCOUNTER — Ambulatory Visit (INDEPENDENT_AMBULATORY_CARE_PROVIDER_SITE_OTHER)

## 2024-12-07 DIAGNOSIS — J302 Other seasonal allergic rhinitis: Secondary | ICD-10-CM

## 2024-12-08 ENCOUNTER — Encounter: Payer: Self-pay | Admitting: Allergy

## 2024-12-08 ENCOUNTER — Other Ambulatory Visit: Payer: Self-pay

## 2024-12-08 ENCOUNTER — Ambulatory Visit: Admitting: Allergy

## 2024-12-08 VITALS — BP 120/70 | HR 87 | Temp 97.9°F | Ht 63.0 in | Wt 141.0 lb

## 2024-12-08 DIAGNOSIS — J3089 Other allergic rhinitis: Secondary | ICD-10-CM

## 2024-12-08 DIAGNOSIS — K200 Eosinophilic esophagitis: Secondary | ICD-10-CM

## 2024-12-08 DIAGNOSIS — H1013 Acute atopic conjunctivitis, bilateral: Secondary | ICD-10-CM

## 2024-12-08 DIAGNOSIS — J4520 Mild intermittent asthma, uncomplicated: Secondary | ICD-10-CM | POA: Diagnosis not present

## 2024-12-08 NOTE — Patient Instructions (Addendum)
 Allergic rhinitis with conjunctivitis   - Continue allergen avoidance measures   - Use Xyzal  5mg  daily as needed   - Use Patanol (olopatadine ) eye drops twice daily as needed for itchy/watery eyes   - Use azelastine  1 to 2 sprays each nostril twice a day as needed for runny nose.  This is more of a maintenance nasal drainage spray   -Use ipratropium spray 2 sprays each nostril up to 4 times a day as needed for nasal drainage/congestion.  This can also be used during respiratory infections/illnesses.   - Continue updated allergen immunotherapy per protocol in build-up phase getting close to maintenance.  Have access to epinephrine   device.      EoE  - continue current dietary avoidance   - Discussed therapeutic options for EOE control.  Will use swallowed budesonide  slurry if needed if symptoms are worsening or if endoscopy shows continued disease  - Continue proton pump inhibitor (acid reflux medicine) as directed by GI  - Continue routine follow-up care with your GI doctor  Reactive airway   - have access to albuterol  inhaler 2 puffs or 1 vial via nebulizer every 4-6 hours as needed for cough/wheeze/shortness of breath/chest tightness.  May use 15-20 minutes prior to activity.   Monitor frequency of use.      -use Symbicort  2 puffs twice a day during respiratory illnesses of flares and can use as needed (do not exceed 10 puffs per day) for cough/wheeze/shortness of breath/chest tightness.  Follow-up 6 months for follow-up or sooner if needed

## 2024-12-08 NOTE — Progress Notes (Signed)
 "   Follow-up Note  RE: Patricia Harvey MRN: [id number] DOB: 1991/04/15 Date of Office Visit: 12/08/2024   History of present illness: Patricia Harvey is a 34 y.o. female presenting today for follow-up of allergic rhinitis with conjunctivitis, EOE, reactive airway.  She was last seen in the office on 09/06/24 by myself.   Discussed the use of AI scribe software for clinical note transcription with the patient, who gave verbal consent to proceed.  She is currently in the buildup phase of her revamped allergy  shot of specific tree pollen, last dose of blue vial at 0.4 cc. Recent illnesses in the household, including the flu and another unspecified illness between Thanksgiving and Christmas, have led her to use nasal sprays and nebulizers.  During recent illnesses, she used Symbicort  and albuterol +albuterol  inhalers to manage symptoms of hoarseness and difficulty breathing. She avoids taking these medications at night as they cause insomnia. Ibuprofen  is used for additional relief.  She experiences a persistent postnasal drip cough, attributed to rhinitis and a post-viral cough. Dextromethorphan gel caps are used for cough management, and azelastine  nasal spray is used once daily, though she is considering increasing the dose due to ongoing postnasal drainage.  She faces challenges with taking prenatal vitamins due to gastrointestinal discomfort, particularly with folate, leading to belching and acid reflux. She is exploring alternative ways to manage her nutritional needs during pregnancy and is considering using famotidine for symptom management.      Review of systems: 10pt ROS negative unless noted above in HPI   Past medical/social/surgical/family history have been reviewed and are unchanged unless specifically indicated below.  No changes  Medication List: Current Outpatient Medications  Medication Sig Dispense Refill   albuterol  (PROVENTIL ) (2.5 MG/3ML) 0.083% nebulizer solution Inhale 3  mLs into the lungs every 4 (four) to 6 (six) hours as needed for wheezing. 90 mL 5   albuterol  (VENTOLIN  HFA) 108 (90 Base) MCG/ACT inhaler INHALE 2 PUFFS INTO THE LUNGS EVERY 4 HOURS AS NEEDED FOR WHEEZING OR SHORTNESS OF BREATH. 6.7 g 1   albuterol  (VENTOLIN  HFA) 108 (90 Base) MCG/ACT inhaler Inhale 2 puffs into the lungs every 4 (four) hours as needed for wheezing or shortness of breath. 18 g 2   azelastine  (ASTELIN ) 0.1 % nasal spray Place 2 sprays into both nostrils 2 (two) times daily. 30 mL 5   Blood Glucose Monitoring Suppl (FREESTYLE LITE) w/Device KIT Use to test blood sugar 2 times a day 1 kit 0   Bromelains 500 MG TABS Take by mouth.     budesonide -formoterol  (SYMBICORT ) 160-4.5 MCG/ACT inhaler Inhale 2 puffs into the lungs 2 (two) times daily. 10.2 g 3   busPIRone  (BUSPAR ) 10 MG tablet Take 1/2 - 1 tablet (5 - 10 mg total) by mouth 3 times daily. Take with food. 270 tablet 1   busPIRone  (BUSPAR ) 10 MG tablet Take 0.5-1 tablets (5-10 mg total) by mouth 3 (three) times daily (take with food). 270 tablet 1   EPINEPHrine  0.3 mg/0.3 mL IJ SOAJ injection INJECT 0.3 MG INTO THE MUSCLE ONCE FOR 1 DOSE AS NEEDED FOR LIFE THREATENING ALLERGIC REACTION. 2 each 1   estradiol  (ESTRACE ) 0.1 MG/GM vaginal cream use a pea sized amount in vagina nightly 42.5 g 0   glucose blood (FREESTYLE LITE) test strip Test blood sugar twice daily as directed 100 strip 0   ipratropium (ATROVENT ) 0.06 % nasal spray Place 2 sprays into both nostrils 3 (three) times daily as needed for rhinitis. 15  mL 5   ipratropium-albuterol  (DUONEB) 0.5-2.5 (3) MG/3ML SOLN 1 vial (3 mL) via nebulizer every 6 hours as needed for cough, wheeze, shortness of breath or chest tightness. 90 mL 1   lamoTRIgine (LAMICTAL) 100 MG tablet Take 100 mg by mouth daily. Take one tablet daily     lamoTRIgine (LAMICTAL) 25 MG tablet Take 25 mg by mouth every morning. Take one tablet daily     Lancets (FREESTYLE) lancets Use to test blood sugar 2 times  a day 100 each 0   levocetirizine (XYZAL ) 5 MG tablet Take 1 tablet (5 mg total) by mouth daily as needed for allergies (Can take an extra dose during flares). 60 tablet 11   olopatadine  (PATANOL) 0.1 % ophthalmic solution Place 1 drop into both eyes 2 (two) times daily as needed for allergies. 5 mL 5   triamcinolone  (KENALOG ) 0.025 % ointment Apply to affected area 2 (two) times daily after allergy  injection 80 g 1   No current facility-administered medications for this visit.     Known medication allergies: Allergies[1]   Physical examination: Blood pressure 120/70, pulse 87, temperature 97.9 F (36.6 C), temperature source Temporal, height 5' 3 (1.6 m), weight 141 lb (64 kg), SpO2 98%.  General: Alert, interactive, in no acute distress. HEENT: PERRLA, TMs pearly gray, turbinates non-edematous without discharge, post-pharynx non erythematous. Neck: Supple without lymphadenopathy. Lungs: Clear to auscultation without wheezing, rhonchi or rales. {no increased work of breathing. CV: Normal S1, S2 without murmurs. Abdomen: Nondistended, nontender. Skin: Warm and dry, without lesions or rashes. Extremities:  No clubbing, cyanosis or edema. Neuro:   Grossly intact.  Diagnostics/Labs: None today  Assessment and plan: Allergic rhinitis with conjunctivitis   - Continue allergen avoidance measures   - Use Xyzal  5mg  daily as needed   - Use Patanol (olopatadine ) eye drops twice daily as needed for itchy/watery eyes   - Use azelastine  1 to 2 sprays each nostril twice a day as needed for runny nose.  This is more of a maintenance nasal drainage spray   -Use ipratropium spray 2 sprays each nostril up to 4 times a day as needed for nasal drainage/congestion.  This can also be used during respiratory infections/illnesses.   - Continue updated allergen immunotherapy per protocol in build-up phase getting close to maintenance.  Have access to epinephrine   device.      EoE  - continue current  dietary avoidance   - Discussed therapeutic options for EOE control.  Will use swallowed budesonide  slurry if needed if symptoms are worsening or if endoscopy shows continued disease  - Continue proton pump inhibitor (acid reflux medicine) as directed by GI  - Continue routine follow-up care with your GI doctor  Reactive airway   - have access to albuterol  inhaler 2 puffs or 1 vial via nebulizer every 4-6 hours as needed for cough/wheeze/shortness of breath/chest tightness.  May use 15-20 minutes prior to activity.   Monitor frequency of use.      -use Symbicort  2 puffs twice a day during respiratory illnesses of flares and can use as needed (do not exceed 10 puffs per day) for cough/wheeze/shortness of breath/chest tightness.  Follow-up 6 months for follow-up or sooner if needed I appreciate the opportunity to take part in [place] care. Please do not hesitate to contact me with questions.  Sincerely,   Danita Brain, MD Allergy /Immunology [facility]       [1]  Allergies Allergen Reactions   Egg Protein-Containing  Drug Products Anaphylaxis   Grass Extracts [Gramineae Pollens] Itching   Msud Aid [Alitraq] Diarrhea   Other Shortness Of Breath and Other (See Comments)    Steroids, panic attack   Sesame Oil Hives, Itching and Anaphylaxis   Sesame Seed (Diagnostic) Shortness Of Breath, Diarrhea, Nausea Only and Other (See Comments)    cramping   Amphetamine-Dextroamphetamine Other (See Comments)    depression  Other Reaction(s): depressed   Methylphenidate  Nausea Only and Other (See Comments)    anxiety/frustrated  Other Reaction(s): anxiety/frustrated   Milk-Related Compounds Diarrhea and Nausea And Vomiting    Cramping & bloating also   Prazosin     Syncope   Wheat Nausea Only and Other (See Comments)    Cramping, lower GI pain, nausea, constipation   Whey Other (See Comments)    Other Reaction(s): Unknown   "

## 2024-12-11 ENCOUNTER — Ambulatory Visit

## 2024-12-11 DIAGNOSIS — J302 Other seasonal allergic rhinitis: Secondary | ICD-10-CM | POA: Diagnosis not present

## 2024-12-13 ENCOUNTER — Other Ambulatory Visit: Payer: Self-pay

## 2024-12-13 ENCOUNTER — Ambulatory Visit: Attending: Nurse Practitioner

## 2024-12-13 ENCOUNTER — Ambulatory Visit (INDEPENDENT_AMBULATORY_CARE_PROVIDER_SITE_OTHER)

## 2024-12-13 DIAGNOSIS — R102 Pelvic and perineal pain unspecified side: Secondary | ICD-10-CM | POA: Diagnosis present

## 2024-12-13 DIAGNOSIS — J302 Other seasonal allergic rhinitis: Secondary | ICD-10-CM

## 2024-12-13 DIAGNOSIS — M6281 Muscle weakness (generalized): Secondary | ICD-10-CM | POA: Diagnosis present

## 2024-12-13 DIAGNOSIS — M5459 Other low back pain: Secondary | ICD-10-CM | POA: Diagnosis present

## 2024-12-13 DIAGNOSIS — R279 Unspecified lack of coordination: Secondary | ICD-10-CM | POA: Insufficient documentation

## 2024-12-13 DIAGNOSIS — M62838 Other muscle spasm: Secondary | ICD-10-CM | POA: Diagnosis present

## 2024-12-13 DIAGNOSIS — N393 Stress incontinence (female) (male): Secondary | ICD-10-CM | POA: Diagnosis present

## 2024-12-13 NOTE — Progress Notes (Signed)
 " OUTPATIENT PHYSICAL THERAPY FEMALE PELVIC EVALUATION   Patient Name: Patricia Harvey MRN: [id number] DOB:08/01/1991, 34 y.o., female Today's Date: 12/13/2024  END OF SESSION:  PT End of Session - 12/13/24 1116     Visit Number 1    Date for Recertification  05/30/25    Authorization Type [facility] Medicaid    Authorization Time Period shara champagne    PT Start Time 1116    PT Stop Time 1145    PT Time Calculation (min) 29 min    Activity Tolerance Patient tolerated treatment well    Behavior During Therapy [facility] for tasks assessed/performed          Past Medical History:  Diagnosis Date   Environmental allergies    Eosinophilic esophagitis    Eosinophilic esophagitis 04/07/2016   Formatting of this note might be different from the original. STs on 04/06/16 were negative.  Status Updated 2021- Negative test results for EOE from endoscopy   IBS (irritable bowel syndrome)    Past Surgical History:  Procedure Laterality Date   BREAST BIOPSY Right 09/25/2024   MM RT BREAST BX W LOC DEV 1ST LESION IMAGE BX SPEC STEREO GUIDE 09/25/2024 [facility] MAMMOGRAPHY   BUNIONECTOMY Right 2014   COLONOSCOPY     TONSILLECTOMY  2004   UPPER GI ENDOSCOPY     UPPER GI ENDOSCOPY     Checked for EOE status. Labs returned saying EOE was negative.    WISDOM TOOTH EXTRACTION     Patient Active Problem List   Diagnosis Date Noted   Erroneous encounter - disregard 05/13/2023   Family history of breast cancer in mother 10/30/2020   Family history of uterine cancer 10/30/2020   Family history of cervical cancer 10/30/2020   Family history of amyotrophic lateral sclerosis 10/30/2020   Family history of high cholesterol 10/30/2020   Family history of type II diabetes mellitus 10/30/2020   Family history of Parkinson's disease 10/30/2020   MDD (major depressive disorder), recurrent severe, 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   Pre-nodular edema of the vocal folds 06/09/2019   Anxiety 05/04/2019   At high risk for  breast cancer 05/04/2019   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   Posttraumatic stress disorder 05/04/2019   Vertigo 05/04/2019   Cervical radiculopathy 03/30/2019   Scoliosis deformity of spine 08/04/2018   Degeneration of lumbar intervertebral disc 08/04/2018   Allergic rhinitis 04/07/2016    PCP: Rolinda Millman, MD  REFERRING PROVIDER: Emilio Domino, NP   REFERRING DIAG: N94.10 (ICD-10-CM) - Unspecified dyspareunia R10.20 (ICD-10-CM) - Pelvic pain  THERAPY DIAG:  Muscle weakness (generalized)  Other muscle spasm  Unspecified lack of coordination  Pelvic pain  Other low back pain  Stress incontinence (female) (female)  Rationale for Evaluation and Treatment: Rehabilitation  ONSET DATE: 11/23  SUBJECTIVE:  SUBJECTIVE STATEMENT: Pt states that she had vaginal delivery with significant tearing. She saw pelvic floor PT for a little while, but states that she is still having issues. She is planning on going through IVF again and is worried she will tear again. She still has some urinary incontinence when jumping. She feels like she has prolapse in standing positions. She'll feel increased pressure and has trouble holding when lifting. She is also still breast feeding.    PAIN:  Are you having pain? Yes NPRS scale: 5/10 Pain location: low back pain  Pain type: aching Pain description: intermittent   Aggravating factors: bending, lifting, lying down, sitting Relieving factors: standing, moving  PRECAUTIONS: None  RED FLAGS: None   WEIGHT BEARING RESTRICTIONS: No  FALLS:  Has patient fallen in last 6 months? 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   ACTIVITY LEVEL : 1-2 days a week walking, gym 1-2x/week  PLOF: Independent  PATIENT GOALS: strengthening so she will not  have prolapse symptoms with second baby; stop leaking  PERTINENT HISTORY:  1 vaginal delivery with tearing Sexual abuse: No  BOWEL MOVEMENT: WNL  URINATION: Pain with urination: No Fully empty bladder: No Stream: Strong Urgency: No Frequency: variable based upon water intake - every 3-4 hours  Nocturia: at least 2x/night Fluid Intake: variable water intake throughout the day - 80 oz a day of water Leakage: jumping and heavy lifting  Pads: No  INTERCOURSE:  Ability to have vaginal penetration Yes - husband says she is very tight Pain with intercourse: none DrynessNo Climax: usually fine Marinoff Scale: 0/3 Lubricant: yes  PREGNANCY: Vaginal deliveries 1 Tearing Yes: significant tearing Episiotomy Yes C-section deliveries 0 Currently pregnant No  PROLAPSE: None   OBJECTIVE:  Note: Objective measures were completed at Evaluation unless otherwise noted.  12/13/2024 PATIENT SURVEYS:   PFIQ-7: UIQ-7 29  COGNITION: Overall cognitive status: Within functional limits for tasks assessed     SENSATION: Light touch: Appears intact   FUNCTIONAL TESTS:  Squat: movement, unstable  Single leg stance:  Rt: pelvic drop  Lt: stable Curl-up test: n oabdominal disotrtion Sit-up test: 2/3    GAIT: Assistive device utilized: None Comments: WNL  POSTURE: elevated Lt iliac crest   LUMBARAROM/PROM:  A/PROM A/PROM  Eval (% available)  Flexion 100  Extension 25  Right lateral flexion 75  Left lateral flexion 75  Right rotation 50  Left rotation 50   (Blank rows = not tested)  PALPATION: General: some tightness in bil lumbar paraspinals, tenderness over sacral base and bil SIJ  Abdominal: Sternocostal angle: WNL Breathing: WNL Tenderness: pubic symphysis and Rt lower quadrant Scar tissue: NA Diastasis: 1-2 finger separation                External Perineal Exam: visible perineal scar tissue                              Internal Pelvic Floor: increased  scar tissue restriction and muscle tension with tenderness in Lt   Patient confirms identification and approves PT to assess internal pelvic floor and treatment Yes  PELVIC MMT:   MMT eval  Vaginal 2/5, 2-3 second hold, 10 repeat contractions, but only 5 with appropriate coordination   (Blank rows = not tested)        TONE: Increased on Lt around scar tissue restriction  PROLAPSE: Grade 1 anterior vaginal wall laxity tested in supine (morning)  TODAY'S  TREATMENT:                                                                                                                              DATE:  12/13/2024 EVAL  Neuromuscular re-education: Initial down training program given Therapeutic activities: Perineal scar tissue mobilization education/pelvic floor muscle release     PATIENT EDUCATION:  Education details: See above Person educated: Patient Education method: Explanation, Demonstration, Tactile cues, Verbal cues, and Handouts Education comprehension: verbalized understanding  HOME EXERCISE PROGRAM: [id number]  ASSESSMENT:  CLINICAL IMPRESSION: Patient is a 33 y.o. female who was seen today for physical therapy evaluation and treatment for stress urinary incontinence, prolapse symptoms, and low back/pelvic pain. Exam findings notable for decreased lumbar A/ROM, pelvic drop in Rt single leg stance, core weakness, abnormal squat, restriction in low back with tenderness, anterior vaginal wall laxity, pelvic floor muscle weakness and decreased coordination, and perineal/vaginal scar tissue restriction and Lt muscular tightness. Signs and symptoms are most consistent with deep core coordination issues and pelvic floor muscle/core weakness; perineal scar tissue restriction could be playing role in this. Initial treatment consisted of pt education on self-scar tissue mobilization and initial down training program to get pelvic floor muscle moving better with breathing. She will continue  to benefit from skilled PT intervention in order to decrease pelvic/low back pain, stop urinary incontinence, and address impairments.   OBJECTIVE IMPAIRMENTS: decreased activity tolerance, decreased coordination, decreased endurance, decreased mobility, decreased ROM, decreased strength, increased fascial restrictions, increased muscle spasms, impaired flexibility, impaired tone, improper body mechanics, postural dysfunction, and pain.   ACTIVITY LIMITATIONS: lifting, bending, sitting, squatting, and continence  PARTICIPATION LIMITATIONS: interpersonal relationship, community activity, and exercise, and childcare activities   PERSONAL FACTORS: 1 comorbidity: medical history are also affecting patient's functional outcome.   REHAB POTENTIAL: Good  CLINICAL DECISION MAKING: Stable/uncomplicated  EVALUATION COMPLEXITY: Low   GOALS: Goals reviewed with patient? Yes  SHORT TERM GOALS: Target date: 01/10/2025   Pt will be independent with HEP in order to improve activity tolerance.   Baseline: Goal status: INITIAL  2.  Patient will report 25% improvement in low back/pelvic pain in order to increase activity tolerance.  5/10 Baseline:  Goal status: INITIAL  3.  Patient will report 25% improvement in urinary incontinence in order to decrease risk of infection and improve quality of life.   Baseline:  Goal status: INITIAL  4.  Pt will be independent with double voiding and voiding schedule to make sure she is emptying bladder enough throughout the day to help decrease stress urinary incontinence and nocturia.  Baseline:  Goal status: INITIAL    LONG TERM GOALS: Target date: 05/30/2025  Pt will be independent with advanced HEP in order to improve activity tolerance.   Baseline:  Goal status: INITIAL  2.  Patient will report 75% improvement in low back/pelvic pain in order to increase activity tolerance.   Baseline: 5/10 Goal status: INITIAL  3.  Pt  will report no leaking  with lifting toddler, functional strength training work outs, or jumping.  Baseline: leaks with all activities Goal status: INITIAL  4.  Pt will demonstrate normal pelvic floor muscle tone and A/ROM, able to achieve 3/5 strength with contractions and 10 sec endurance, in order to reduce urinary leaking and number of pads patient wears.   Baseline: 2/5, 2-3 second hold Goal status: INITIAL  5.  Pt will be able to perform sit-up test with 3/3 in order demonstrate appropriate pressure management and core strength to avoid increased prolapse symptoms with second pregnancy and delivery.  Baseline:  Goal status: INITIAL  6. Pt will decrease UIQ-7 score to less than 10 in order to demonstrate decreased impact of urinary incontinence on functional activities.   Baseline: 29  Goal status: INITIAL  PLAN:  PT FREQUENCY: 1-2x/week  PT DURATION: 12 visits    PLANNED INTERVENTIONS: 97164- PT Re-evaluation, 97110-Therapeutic exercises, 97530- Therapeutic activity, 97112- Neuromuscular re-education, 97535- Self Care, 02859- Manual therapy, [phone]- Gait training, [phone]- Aquatic Therapy, [phone]- Electrical stimulation (unattended), [phone]- Traction (mechanical), [id number]- Ionotophoresis 4mg /ml Dexamethasone, 20560 (1-2 muscles), 20561 (3+ muscles)- Dry Needling, Patient/Family education, Balance training, Taping, Joint mobilization, Joint manipulation, Spinal manipulation, Spinal mobilization, Scar mobilization, Vestibular training, Cryotherapy, Moist heat, and Biofeedback  PLAN FOR NEXT SESSION: continue manual scar tissue mobilization to perineum and vaginal wall; pelvic floor muscle contraction training; core training  Josette Mares, PT, DPT1/14/20261:45 PM [facility] [address] [place], [place] [address] Phone # [phone] Fax [phone]   "

## 2024-12-19 ENCOUNTER — Ambulatory Visit (INDEPENDENT_AMBULATORY_CARE_PROVIDER_SITE_OTHER)

## 2024-12-19 DIAGNOSIS — J302 Other seasonal allergic rhinitis: Secondary | ICD-10-CM

## 2024-12-22 ENCOUNTER — Ambulatory Visit (INDEPENDENT_AMBULATORY_CARE_PROVIDER_SITE_OTHER)

## 2024-12-22 DIAGNOSIS — J302 Other seasonal allergic rhinitis: Secondary | ICD-10-CM | POA: Diagnosis not present

## 2024-12-26 ENCOUNTER — Ambulatory Visit

## 2024-12-26 DIAGNOSIS — J302 Other seasonal allergic rhinitis: Secondary | ICD-10-CM

## 2025-01-04 ENCOUNTER — Ambulatory Visit

## 2025-01-04 DIAGNOSIS — R102 Pelvic and perineal pain unspecified side: Secondary | ICD-10-CM

## 2025-01-04 DIAGNOSIS — R279 Unspecified lack of coordination: Secondary | ICD-10-CM

## 2025-01-04 DIAGNOSIS — N393 Stress incontinence (female) (male): Secondary | ICD-10-CM

## 2025-01-04 DIAGNOSIS — M6281 Muscle weakness (generalized): Secondary | ICD-10-CM

## 2025-01-04 DIAGNOSIS — M5459 Other low back pain: Secondary | ICD-10-CM

## 2025-01-04 DIAGNOSIS — M62838 Other muscle spasm: Secondary | ICD-10-CM

## 2025-01-04 NOTE — Progress Notes (Signed)
 " OUTPATIENT PHYSICAL THERAPY FEMALE PELVIC TREATMENT   Patient Name: Patricia Harvey MRN: [id number] DOB:11-17-1991, 34 y.o., female Today's Date: 01/04/2025  END OF SESSION:  PT End of Session - 01/04/25 0856     Visit Number 2    Date for Recertification  05/30/25    Authorization Type [facility] Medicaid    Authorization Time Period 12/13/24-03/10/25    Authorization - Visit Number 2   accidentally charged on first session   Authorization - Number of Visits 12    PT Start Time [phone]    PT Stop Time 0932    PT Time Calculation (min) 38 min    Activity Tolerance Patient tolerated treatment well    Behavior During Therapy [facility] for tasks assessed/performed          Past Medical History:  Diagnosis Date   Environmental allergies    Eosinophilic esophagitis    Eosinophilic esophagitis 04/07/2016   Formatting of this note might be different from the original. STs on 04/06/16 were negative.  Status Updated 2021- Negative test results for EOE from endoscopy   IBS (irritable bowel syndrome)    Past Surgical History:  Procedure Laterality Date   BREAST BIOPSY Right 09/25/2024   MM RT BREAST BX W LOC DEV 1ST LESION IMAGE BX SPEC STEREO GUIDE 09/25/2024 [facility]AMMOGRAP[facility]   BUNIONECTOMY Right 2014   COLONOSCOPY     TONSILLECTOMY  2004   UPPER GI ENDOSCOPY     UPPER GI ENDOSCOPY     Checked for EOE status. Labs returned saying EOE was negative.    WISDOM TOOTH EXTRACTION     Patient Active Problem List   Diagnosis Date Noted   Erroneous encounter - disregard 05/13/2023   Family history of breast cancer in mother 10/30/2020   Family history of uterine cancer 10/30/2020   Family history of cervical cancer 10/30/2020   Family history of amyotrophic lateral sclerosis 10/30/2020   Family history of high cholesterol 10/30/2020   Family history of type II diabetes mellitus 10/30/2020   Family history of Parkinson's disease 10/30/2020   MDD (major depressive disorder), recurrent severe, without  psychosis (HCC) 12/26/2019   Pre-nodular edema of the vocal folds 06/09/2019   Anxiety 05/04/2019   At high risk for breast cancer 05/04/2019   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   Posttraumatic stress disorder 05/04/2019   Vertigo 05/04/2019   Cervical radiculopathy 03/30/2019   Scoliosis deformity of spine 08/04/2018   Degeneration of lumbar intervertebral disc 08/04/2018   Allergic rhinitis 04/07/2016    PCP: Rolinda Millman, MD   REFERRING PROVIDER: Emilio Domino, NP            REFERRING DIAG: N94.10 (ICD-10-CM) - Unspecified dyspareunia R10.20 (ICD-10-CM) - Pelvic pain   THERAPY DIAG:  Muscle weakness (generalized)   Other muscle spasm   Unspecified lack of coordination   Pelvic pain   Other low back pain   Stress incontinence (female) (female)   Rationale for Evaluation and Treatment: Rehabilitation   ONSET DATE: 11/23   SUBJECTIVE:  SUBJECTIVE STATEMENT: Pt states that she has gotten internal EMG device to help strengthen her pelvic floor and she is wondering if this is a good thing to work with.     PAIN:  Are you having pain? Yes NPRS scale: 5/10 Pain location: low back pain   Pain type: aching Pain description: intermittent    Aggravating factors: bending, lifting, lying down, sitting Relieving factors: standing, moving   PRECAUTIONS: None   RED FLAGS: None       WEIGHT BEARING RESTRICTIONS: No   FALLS:  Has patient fallen in last 6 months? 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    ACTIVITY LEVEL : 1-2 days a week walking, gym 1-2x/week   PLOF: Independent   PATIENT GOALS: strengthening so she will not have prolapse symptoms with second baby; stop leaking   PERTINENT HISTORY:  1 vaginal delivery with tearing Sexual abuse: No   BOWEL  MOVEMENT: WNL   URINATION: Pain with urination: No Fully empty bladder: No Stream: Strong Urgency: No Frequency: variable based upon water intake - every 3-4 hours  Nocturia: at least 2x/night Fluid Intake: variable water intake throughout the day - 80 oz a day of water Leakage: jumping and heavy lifting  Pads: No   INTERCOURSE:             Ability to have vaginal penetration Yes - husband says she is very tight Pain with intercourse: none DrynessNo Climax: usually fine Marinoff Scale: 0/3 Lubricant: yes   PREGNANCY: Vaginal deliveries 1 Tearing Yes: significant tearing Episiotomy Yes C-section deliveries 0 Currently pregnant No   PROLAPSE: None     OBJECTIVE:  Note: Objective measures were completed at Evaluation unless otherwise noted.   12/13/2024 PATIENT SURVEYS:    PFIQ-7: UIQ-7 29   COGNITION: Overall cognitive status: Within functional limits for tasks assessed                          SENSATION: Light touch: Appears intact     FUNCTIONAL TESTS:  Squat: movement, unstable  Single leg stance:             Rt: pelvic drop             Lt: stable Curl-up test: n oabdominal disotrtion Sit-up test: 2/3       GAIT: Assistive device utilized: None Comments: WNL   POSTURE: elevated Lt iliac crest     LUMBARAROM/PROM:   A/PROM A/PROM  Eval (% available)  Flexion 100  Extension 25  Right lateral flexion 75  Left lateral flexion 75  Right rotation 50  Left rotation 50   (Blank rows = not tested)   PALPATION: General: some tightness in bil lumbar paraspinals, tenderness over sacral base and bil SIJ   Abdominal: Sternocostal angle: WNL Breathing: WNL Tenderness: pubic symphysis and Rt lower quadrant Scar tissue: NA Diastasis: 1-2 finger separation                 External Perineal Exam: visible perineal scar tissue                              Internal Pelvic Floor: increased scar tissue restriction and muscle tension with tenderness in  Lt    Patient confirms identification and approves PT to assess internal pelvic floor and treatment Yes   PELVIC MMT:   MMT eval  Vaginal 2/5, 2-3 second  hold, 10 repeat contractions, but only 5 with appropriate coordination   (Blank rows = not tested)         TONE: Increased on Lt around scar tissue restriction   PROLAPSE: Grade 1 anterior vaginal wall laxity tested in supine (morning)   TODAY'S TREATMENT:                                                                                                                              DATE:  01/04/2025 Manual: Pt provides verbal consent for internal vaginal/rectal pelvic floor exam. Internal superficial perineal and vaginal scar tissue mobilization Internal superficial and middle pelvic floor muscle layer muscle release Neuromuscular re-education: Internal vaginal pelvic floor muscle contraction training with breath coordination Muscle energy techniques to help improve facilitation of stronger pelvic floor muscle contractions, focus on muscle tapping Discussed using her finger vaginally for feedback when she is working on contractions at home Quick flicks 2 x 10 Long holds 10 x 10 seconds    12/13/2024 EVAL  Neuromuscular re-education: Initial down training program given Therapeutic activities: Perineal scar tissue mobilization education/pelvic floor muscle release        PATIENT EDUCATION:  Education details: See above Person educated: Patient Education method: Programmer, Multimedia, Demonstration, Tactile cues, Verbal cues, and Handouts Education comprehension: verbalized understanding   HOME EXERCISE PROGRAM: [id number]   ASSESSMENT:   CLINICAL IMPRESSION: Patient is a 34 y.o. female who was seen today for physical therapy  treatment for stress urinary incontinence, prolapse symptoms, and low back/pelvic pain. Returned to internal pelvic floor scar tissue mobilization to help improve restriction and improve muscular tightness  surrounding these areas. Restriction is improving and responded very well wot manual techniques today. There was enough progress we felt ocnfident working on pelvic floor muscle contraction training. She still is having difficulty contraction around scar tissue, but muscle energy tapping very helpful to improve. She reported difficulty with proprioception, but working on long holds with feedback improved. HEP updated and she was instructed that use of her internal device is fine. She will continue to benefit from skilled PT intervention in order to decrease pelvic/low back pain, stop urinary incontinence, and address impairments.    OBJECTIVE IMPAIRMENTS: decreased activity tolerance, decreased coordination, decreased endurance, decreased mobility, decreased ROM, decreased strength, increased fascial restrictions, increased muscle spasms, impaired flexibility, impaired tone, improper body mechanics, postural dysfunction, and pain.    ACTIVITY LIMITATIONS: lifting, bending, sitting, squatting, and continence   PARTICIPATION LIMITATIONS: interpersonal relationship, community activity, and exercise, and childcare activities    PERSONAL FACTORS: 1 comorbidity: medical history are also affecting patient's functional outcome.    REHAB POTENTIAL: Good   CLINICAL DECISION MAKING: Stable/uncomplicated   EVALUATION COMPLEXITY: Low     GOALS: Goals reviewed with patient? Yes   SHORT TERM GOALS: Target date: 01/10/2025     Pt will be independent with HEP in order to improve activity tolerance.    Baseline: Goal status: INITIAL   2.  Patient will report 25% improvement in low back/pelvic pain in order to increase activity tolerance.  5/10 Baseline:  Goal status: INITIAL   3.  Patient will report 25% improvement in urinary incontinence in order to decrease risk of infection and improve quality of life.    Baseline:  Goal status: INITIAL   4.  Pt will be independent with double voiding and  voiding schedule to make sure she is emptying bladder enough throughout the day to help decrease stress urinary incontinence and nocturia.  Baseline:  Goal status: INITIAL       LONG TERM GOALS: Target date: 05/30/2025   Pt will be independent with advanced HEP in order to improve activity tolerance.    Baseline:  Goal status: INITIAL   2.  Patient will report 75% improvement in low back/pelvic pain in order to increase activity tolerance.    Baseline: 5/10 Goal status: INITIAL   3.  Pt will report no leaking with lifting toddler, functional strength training work outs, or jumping.  Baseline: leaks with all activities Goal status: INITIAL   4.  Pt will demonstrate normal pelvic floor muscle tone and A/ROM, able to achieve 3/5 strength with contractions and 10 sec endurance, in order to reduce urinary leaking and number of pads patient wears.    Baseline: 2/5, 2-3 second hold Goal status: INITIAL   5.  Pt will be able to perform sit-up test with 3/3 in order demonstrate appropriate pressure management and core strength to avoid increased prolapse symptoms with second pregnancy and delivery.  Baseline:  Goal status: INITIAL   6. Pt will decrease UIQ-7 score to less than 10 in order to demonstrate decreased impact of urinary incontinence on functional activities.              Baseline: 29             Goal status: INITIAL   PLAN:   PT FREQUENCY: 1-2x/week   PT DURATION: 12 visits     PLANNED INTERVENTIONS: 97164- PT Re-evaluation, 97110-Therapeutic exercises, 97530- Therapeutic activity, 97112- Neuromuscular re-education, 97535- Self Care, 02859- Manual therapy, [phone]- Gait training, [phone]- Aquatic Therapy, [phone]- Electrical stimulation (unattended), [phone]- Traction (mechanical), [id number]- Ionotophoresis 4mg /ml Dexamethasone, 20560 (1-2 muscles), 20561 (3+ muscles)- Dry Needling, Patient/Family education, Balance training, Taping, Joint mobilization, Joint manipulation, Spinal  manipulation, Spinal mobilization, Scar mobilization, Vestibular training, Cryotherapy, Moist heat, and Biofeedback   PLAN FOR NEXT SESSION: continue manual scar tissue mobilization to perineum and vaginal wall; pelvic floor muscle contraction training; core training  Josette Mares, PT, DPT02/05/269:49 AM [facility] [address] [place], [place] [address] Phone # [phone] Fax [phone]  "

## 2025-01-05 ENCOUNTER — Ambulatory Visit (INDEPENDENT_AMBULATORY_CARE_PROVIDER_SITE_OTHER)

## 2025-01-05 DIAGNOSIS — J302 Other seasonal allergic rhinitis: Secondary | ICD-10-CM

## 2025-01-09 ENCOUNTER — Ambulatory Visit

## 2025-01-15 ENCOUNTER — Ambulatory Visit

## 2025-01-22 ENCOUNTER — Ambulatory Visit

## 2025-01-29 ENCOUNTER — Ambulatory Visit

## 2025-02-05 ENCOUNTER — Ambulatory Visit

## 2025-02-12 ENCOUNTER — Ambulatory Visit

## 2025-02-19 ENCOUNTER — Ambulatory Visit

## 2025-06-20 ENCOUNTER — Ambulatory Visit: Admitting: Allergy
